# Patient Record
Sex: Male | Born: 1950
Health system: Southern US, Community
[De-identification: ages and names within clinical notes are randomized; demographics above are authoritative.]

## PROBLEM LIST (undated history)

## (undated) DIAGNOSIS — E785 Hyperlipidemia, unspecified: Secondary | ICD-10-CM

## (undated) DIAGNOSIS — K219 Gastro-esophageal reflux disease without esophagitis: Secondary | ICD-10-CM

## (undated) DIAGNOSIS — M6530 Trigger finger, unspecified finger: Secondary | ICD-10-CM

## (undated) DIAGNOSIS — M1990 Unspecified osteoarthritis, unspecified site: Secondary | ICD-10-CM

## (undated) DIAGNOSIS — R739 Hyperglycemia, unspecified: Secondary | ICD-10-CM

## (undated) DIAGNOSIS — Z8719 Personal history of other diseases of the digestive system: Secondary | ICD-10-CM

## (undated) DIAGNOSIS — H9190 Unspecified hearing loss, unspecified ear: Secondary | ICD-10-CM

## (undated) DIAGNOSIS — K4090 Unilateral inguinal hernia, without obstruction or gangrene, not specified as recurrent: Secondary | ICD-10-CM

## (undated) DIAGNOSIS — R251 Tremor, unspecified: Secondary | ICD-10-CM

## (undated) HISTORY — DX: Hyperglycemia, unspecified: R73.9

## (undated) HISTORY — DX: Tremor, unspecified: R25.1

## (undated) HISTORY — DX: Unspecified hearing loss, unspecified ear: H91.90

## (undated) HISTORY — DX: Trigger finger, unspecified finger: M65.30

## (undated) HISTORY — PX: CATARACT EXTRACTION, BILATERAL: SHX1313

## (undated) HISTORY — DX: Unilateral inguinal hernia, without obstruction or gangrene, not specified as recurrent: K40.90

## (undated) HISTORY — DX: Personal history of other diseases of the digestive system: Z87.19

## (undated) HISTORY — DX: Hyperlipidemia, unspecified: E78.5

---

## 2002-08-16 LAB — HM COLONOSCOPY: HM COLON: NORMAL

## 2004-05-27 ENCOUNTER — Ambulatory Visit: Payer: Self-pay | Admitting: Internal Medicine

## 2005-02-24 ENCOUNTER — Ambulatory Visit: Payer: Self-pay | Admitting: Ophthalmology

## 2006-01-31 ENCOUNTER — Emergency Department: Payer: Self-pay | Admitting: Emergency Medicine

## 2013-08-16 HISTORY — PX: COLONOSCOPY: SHX174

## 2014-04-11 LAB — LIPID PANEL
Cholesterol: 186 mg/dL (ref 0–200)
HDL: 52 mg/dL (ref 35–70)
LDL CALC: 114 mg/dL
TRIGLYCERIDES: 101 mg/dL (ref 40–160)

## 2014-04-11 LAB — HEMOGLOBIN A1C: HEMOGLOBIN A1C: 5.7 % (ref 4.0–6.0)

## 2014-05-23 LAB — HM COLONOSCOPY: HM Colonoscopy: NEGATIVE

## 2015-04-13 ENCOUNTER — Encounter: Payer: Self-pay | Admitting: Family Medicine

## 2015-04-13 DIAGNOSIS — H9190 Unspecified hearing loss, unspecified ear: Secondary | ICD-10-CM | POA: Insufficient documentation

## 2015-04-13 DIAGNOSIS — E663 Overweight: Secondary | ICD-10-CM | POA: Insufficient documentation

## 2015-04-13 DIAGNOSIS — K4090 Unilateral inguinal hernia, without obstruction or gangrene, not specified as recurrent: Secondary | ICD-10-CM | POA: Insufficient documentation

## 2015-04-13 DIAGNOSIS — R251 Tremor, unspecified: Secondary | ICD-10-CM | POA: Insufficient documentation

## 2015-04-14 ENCOUNTER — Encounter: Payer: Self-pay | Admitting: Family Medicine

## 2015-04-14 ENCOUNTER — Ambulatory Visit (INDEPENDENT_AMBULATORY_CARE_PROVIDER_SITE_OTHER): Payer: BLUE CROSS/BLUE SHIELD | Admitting: Family Medicine

## 2015-04-14 VITALS — BP 114/66 | HR 71 | Temp 98.3°F | Resp 16 | Ht 70.0 in | Wt 184.0 lb

## 2015-04-14 DIAGNOSIS — Z125 Encounter for screening for malignant neoplasm of prostate: Secondary | ICD-10-CM

## 2015-04-14 DIAGNOSIS — Z23 Encounter for immunization: Secondary | ICD-10-CM

## 2015-04-14 DIAGNOSIS — R739 Hyperglycemia, unspecified: Secondary | ICD-10-CM | POA: Diagnosis not present

## 2015-04-14 DIAGNOSIS — Z1211 Encounter for screening for malignant neoplasm of colon: Secondary | ICD-10-CM

## 2015-04-14 DIAGNOSIS — Z0000 Encounter for general adult medical examination without abnormal findings: Secondary | ICD-10-CM

## 2015-04-14 DIAGNOSIS — Z13220 Encounter for screening for lipoid disorders: Secondary | ICD-10-CM

## 2015-04-14 DIAGNOSIS — R251 Tremor, unspecified: Secondary | ICD-10-CM | POA: Diagnosis not present

## 2015-04-14 MED ORDER — ASPIRIN EC 81 MG PO TBEC: 81.0000 mg | DELAYED_RELEASE_TABLET | Freq: Every day | ORAL | Status: AC

## 2015-04-14 NOTE — Progress Notes (Signed)
Name: Lee Harvey   MRN: [id number]    DOB: 01/31/1951   Date:04/14/2015       Progress Note  Subjective  Chief Complaint  Chief Complaint  Patient presents with  . Annual Exam    HPI  Male Exam: he has not complaints, still has tremors on both hands - left worse than right - but does not want to be treated or see a neurologist for further evaluation. His decreased in hearing is stable.  He is active, push mow his yard once a week, goes bowling weekly. He has lost weight since he stopped drinking 3 cans of sodas daily, drinking water or unsweet tea.  He denies chest pain, decrease in exercise tolerance, or ED, he has nocturia once per night, but not every night. Inguinal hernia is not bothering him and I reviewed symptoms of incarceration and need to seek medical attention.     Patient Active Problem List   Diagnosis Date Noted  . Difficulty hearing 04/13/2015  . Inguinal hernia 04/13/2015  . Tremor of both hands 04/13/2015  . Excess weight 04/13/2015    Past Surgical History  Procedure Laterality Date  . Cataract extraction, bilateral      Family History  Problem Relation Age of Onset  . Dementia Mother   . Cancer Father 20    Colon  . Thyroid disease Sister   . Drug abuse Brother     Social History   Social History  . Marital Status: Single    Spouse Name: N/A  . Number of Children: N/A  . Years of Education: N/A   Occupational History  . Not on file.   Social History Main Topics  . Smoking status: Former Smoker -- 0.75 packs/day for 20 years    Types: Cigarettes    Start date: 08/16/1986    Quit date: 08/16/2006  . Smokeless tobacco: Never Used  . Alcohol Use: No  . Drug Use: No  . Sexual Activity:    Partners: Female   Other Topics Concern  . Not on file   Social History Narrative     Current outpatient prescriptions:  Marland Kitchen  MULTIPLE VITAMIN PO, Take 1 tablet by mouth daily., Disp: , Rfl:  .  aspirin EC 81 MG tablet, Take 1 tablet (81 mg total)  by mouth daily., Disp: 30 tablet, Rfl: 0  No Known Allergies   ROS  Constitutional: Negative for fever or weight change.  Respiratory: Negative for cough and shortness of breath.   Cardiovascular: Negative for chest pain or palpitations.  Gastrointestinal: Negative for abdominal pain, no bowel changes.  Musculoskeletal: Negative for gait problem or joint swelling.  Skin: Negative for rash.  Neurological: Negative for dizziness or headache.  No other specific complaints in a complete review of systems (except as listed in HPI above).  Objective  Filed Vitals:   04/14/15 0840  BP: 114/66  Pulse: 71  Temp: 98.3 F (36.8 C)  TempSrc: Oral  Resp: 16  Height: 5\' 10"  (1.778 m)  Weight: 184 lb (83.462 kg)  SpO2: 96%    Body mass index is 26.4 kg/(m^2).  Physical Exam  Constitutional: Patient appears well-developed and well-nourished. No distress.  HENT: Head: Normocephalic and atraumatic. Ears: B TMs ok, no erythema or effusion; Nose: Nose normal. Mouth/Throat: Oropharynx is clear and moist. No oropharyngeal exudate.  Eyes: Conjunctivae and EOM are normal. Pupils are equal, round, and reactive to light. No scleral icterus.  Neck: Normal range of motion. Neck supple. No  JVD present. No thyromegaly present.  Cardiovascular: Normal rate, regular rhythm and normal heart sounds.  No murmur heard. No BLE edema. Pulmonary/Chest: Effort normal and breath sounds normal. No respiratory distress. Abdominal: Soft. Bowel sounds are normal, no distension. There is no tenderness. no masses MALE GENITALIA: Normal descended testes bilaterally, no masses palpated, no hernias, no lesions, no discharge RECTAL: Prostate normal size and consistency, no rectal masses or hemorrhoids Musculoskeletal: Normal range of motion, no joint effusions. No gross deformities Neurological: he is alert and oriented to person, place, and time. No cranial nerve deficit. Coordination, balance, strength, speech and gait  are normal.  Skin: Skin is warm and dry. No rash noted. No erythema.  Psychiatric: Patient has a normal mood and affect. behavior is normal. Judgment and thought content normal.   PHQ2/9: Depression screen PHQ 2/9 04/14/2015  Decreased Interest 0  Down, Depressed, Hopeless 0  PHQ - 2 Score 0     Fall Risk: Fall Risk  04/14/2015  Falls in the past year? No      Assessment & Plan  1. Encounter for routine history and physical exam for male Discussed importance of 150 minutes of physical activity weekly, eat two servings of fish weekly, eat one serving of tree nuts ( cashews, pistachios, pecans, almonds.Marland Kitchen) every other day, eat 6 servings of fruit/vegetables daily and drink plenty of water and avoid sweet beverages.   2. Need for vaccination for H flu type B  - Flu Vaccine QUAD 36+ mos PF IM (Fluarix & Fluzone Quad PF)  3. Prostate cancer screening  - PSA  4. Colon cancer screening Had Cologuard in 2015  5. Hyperglycemia  - Hemoglobin A1c  6. Screening, lipid  - Lipid panel  7. Tremor of both hands He does not want to see neurologist at this time, no symptoms of Parkinson's may be essential tremors, does not want to be treated

## 2015-04-15 ENCOUNTER — Telehealth: Payer: Self-pay

## 2015-04-15 LAB — LIPID PANEL
CHOL/HDL RATIO: 2.6 ratio (ref 0.0–5.0)
Cholesterol, Total: 148 mg/dL (ref 100–199)
HDL: 57 mg/dL (ref >39)
LDL Calculated: 80 mg/dL (ref 0–99)
Triglycerides: 56 mg/dL (ref 0–149)
VLDL CHOLESTEROL CAL: 11 mg/dL (ref 5–40)

## 2015-04-15 LAB — HEMOGLOBIN A1C
Est. average glucose Bld gHb Est-mCnc: 120 mg/dL
Hgb A1c MFr Bld: 5.8 % — ABNORMAL HIGH (ref 4.8–5.6)

## 2015-04-15 LAB — PSA: Prostate Specific Ag, Serum: 1.2 ng/mL (ref 0.0–4.0)

## 2015-04-15 NOTE — Telephone Encounter (Signed)
Left message for patient to return my call for labs 

## 2015-04-15 NOTE — Telephone Encounter (Signed)
-----   Message from Steele Sizer, MD sent at 04/15/2015  8:30 AM EDT ----- hgbA1C slightly elevated Lipid panel within normal limits Normal PSA

## 2015-04-16 NOTE — Progress Notes (Signed)
Patient notified

## 2016-04-15 ENCOUNTER — Ambulatory Visit (INDEPENDENT_AMBULATORY_CARE_PROVIDER_SITE_OTHER): Payer: BLUE CROSS/BLUE SHIELD | Admitting: Family Medicine

## 2016-04-15 ENCOUNTER — Encounter: Payer: Self-pay | Admitting: Family Medicine

## 2016-04-15 VITALS — BP 136/74 | HR 56 | Temp 98.4°F | Resp 16 | Ht 70.0 in | Wt 193.2 lb

## 2016-04-15 DIAGNOSIS — Z125 Encounter for screening for malignant neoplasm of prostate: Secondary | ICD-10-CM

## 2016-04-15 DIAGNOSIS — Z23 Encounter for immunization: Secondary | ICD-10-CM | POA: Diagnosis not present

## 2016-04-15 DIAGNOSIS — Z0000 Encounter for general adult medical examination without abnormal findings: Secondary | ICD-10-CM

## 2016-04-15 DIAGNOSIS — R739 Hyperglycemia, unspecified: Secondary | ICD-10-CM | POA: Diagnosis not present

## 2016-04-15 LAB — CBC WITH DIFFERENTIAL/PLATELET
BASOS ABS: 59 {cells}/uL (ref 0–200)
Basophils Relative: 1 %
Eosinophils Absolute: 177 cells/uL (ref 15–500)
Eosinophils Relative: 3 %
HEMATOCRIT: 44.6 % (ref 38.5–50.0)
HEMOGLOBIN: 15.6 g/dL (ref 13.2–17.1)
LYMPHS ABS: 1770 {cells}/uL (ref 850–3900)
Lymphocytes Relative: 30 %
MCH: 32.6 pg (ref 27.0–33.0)
MCHC: 35 g/dL (ref 32.0–36.0)
MCV: 93.1 fL (ref 80.0–100.0)
MONO ABS: 531 {cells}/uL (ref 200–950)
MPV: 10.1 fL (ref 7.5–12.5)
Monocytes Relative: 9 %
NEUTROS PCT: 57 %
Neutro Abs: 3363 cells/uL (ref 1500–7800)
Platelets: 218 10*3/uL (ref 140–400)
RBC: 4.79 MIL/uL (ref 4.20–5.80)
RDW: 13.1 % (ref 11.0–15.0)
WBC: 5.9 10*3/uL (ref 3.8–10.8)

## 2016-04-15 LAB — COMPLETE METABOLIC PANEL WITH GFR
ALBUMIN: 4.3 g/dL (ref 3.6–5.1)
ALK PHOS: 86 U/L (ref 40–115)
ALT: 22 U/L (ref 9–46)
AST: 21 U/L (ref 10–35)
BUN: 17 mg/dL (ref 7–25)
CALCIUM: 9.4 mg/dL (ref 8.6–10.3)
CO2: 23 mmol/L (ref 20–31)
Chloride: 106 mmol/L (ref 98–110)
Creat: 1.13 mg/dL (ref 0.70–1.25)
GFR, EST NON AFRICAN AMERICAN: 69 mL/min (ref >=60)
GFR, Est African American: 80 mL/min (ref >=60)
Glucose, Bld: 107 mg/dL — ABNORMAL HIGH (ref 65–99)
POTASSIUM: 4.7 mmol/L (ref 3.5–5.3)
Sodium: 141 mmol/L (ref 135–146)
Total Bilirubin: 0.6 mg/dL (ref 0.2–1.2)
Total Protein: 7 g/dL (ref 6.1–8.1)

## 2016-04-15 LAB — PSA: PSA: 0.9 ng/mL (ref <=4.0)

## 2016-04-15 LAB — LIPID PANEL
CHOLESTEROL: 213 mg/dL — AB (ref 125–200)
HDL: 70 mg/dL (ref >=40)
LDL Cholesterol: 128 mg/dL (ref <130)
Total CHOL/HDL Ratio: 3 Ratio (ref <=5.0)
Triglycerides: 75 mg/dL (ref <150)
VLDL: 15 mg/dL (ref <30)

## 2016-04-15 NOTE — Progress Notes (Signed)
Name: Lee Harvey   MRN: [id number]    DOB: May 19, 1951   Date:04/15/2016       Progress Note  Subjective  Chief Complaint  Chief Complaint  Patient presents with  . Annual Exam    HPI  Male Exam: he has not complaints, still has tremors on both hands - left worse than right - but does not want to be treated or see a neurologist for further evaluation. His decreased in hearing is stable, he does not want hearing aids at this time.  He is active, he is walking a couple times a week and  goes bowling weekly. He has gained weight again, he is drinking sodas and sweet tea daily again.  He denies chest pain, decrease in exercise tolerance, or ED, he has nocturia once per night. Inguinal hernia is not bothering him and I reviewed symptoms of incarceration and need to seek medical attention.   IPSS Questionnaire (AUA-7): Over the past month.   1)  How often have you had a sensation of not emptying your bladder completely after you finish urinating?  1 - Less than 1 time in 5  2)  How often have you had to urinate again less than two hours after you finished urinating? 0 - Not at all  3)  How often have you found you stopped and started again several times when you urinated?  0 - Not at all  4) How difficult have you found it to postpone urination?  0 - Not at all  5) How often have you had a weak urinary stream?  1 - Less than 1 time in 5  6) How often have you had to push or strain to begin urination?  0 - Not at all  7) How many times did you most typically get up to urinate from the time you went to bed until the time you got up in the morning?  1 - 1 time  Total score:  0-7 mildly symptomatic   8-19 moderately symptomatic   20-35 severely symptomatic      Patient Active Problem List   Diagnosis Date Noted  . Hyperglycemia 04/15/2016  . Difficulty hearing 04/13/2015  . Inguinal hernia 04/13/2015  . Tremor of both hands 04/13/2015  . Excess weight 04/13/2015    Past Surgical  History:  Procedure Laterality Date  . CATARACT EXTRACTION, BILATERAL      Family History  Problem Relation Age of Onset  . Dementia Mother   . Cancer Father 65    Colon  . Thyroid disease Sister   . Drug abuse Brother     Social History   Social History  . Marital status: Single    Spouse name: N/A  . Number of children: N/A  . Years of education: N/A   Occupational History  . Not on file.   Social History Main Topics  . Smoking status: Former Smoker    Packs/day: 0.75    Years: 20.00    Types: Cigarettes    Start date: 08/16/1986    Quit date: 08/16/2006  . Smokeless tobacco: Never Used  . Alcohol use No  . Drug use: No  . Sexual activity: Yes    Partners: Female   Other Topics Concern  . Not on file   Social History Narrative  . No narrative on file     Current Outpatient Prescriptions:  .  aspirin EC 81 MG tablet, Take 1 tablet (81 mg total) by mouth daily., Disp:  30 tablet, Rfl: 0 .  MULTIPLE VITAMIN PO, Take 1 tablet by mouth daily., Disp: , Rfl:   No Known Allergies   ROS  Constitutional: Negative for fever or weight change.  Respiratory: Negative for cough and shortness of breath.   Cardiovascular: Negative for chest pain or palpitations.  Gastrointestinal: Negative for abdominal pain, no bowel changes.  Musculoskeletal: Negative for gait problem or joint swelling.  Skin: Negative for rash.  Neurological: Negative for dizziness or headache.  No other specific complaints in a complete review of systems (except as listed in HPI above).  Objective  Vitals:   04/15/16 0832  BP: 136/74  Pulse: (!) 56  Resp: 16  Temp: 98.4 F (36.9 C)  TempSrc: Oral  SpO2: 97%  Weight: 193 lb 3.2 oz (87.6 kg)  Height: 5\' 10"  (1.778 m)    Body mass index is 27.72 kg/m.  Physical Exam  Constitutional: Patient appears well-developed and well-nourished. No distress.  HENT: Head: Normocephalic and atraumatic. Ears: B TMs ok, no erythema or effusion; Nose:  Nose normal. Mouth/Throat: Oropharynx is clear and moist. No oropharyngeal exudate.  Eyes: Conjunctivae and EOM are normal. Pupils are equal, round, and reactive to light. No scleral icterus.  Neck: Normal range of motion. Neck supple. No JVD present. No thyromegaly present.  Cardiovascular: Normal rate, regular rhythm and normal heart sounds.  No murmur heard. No BLE edema. Pulmonary/Chest: Effort normal and breath sounds normal. No respiratory distress. Abdominal: Soft. Bowel sounds are normal, no distension. There is no tenderness. left direct large inguinal hernia. Advised surgery and he will call back when ready for referral  MALE GENITALIA: Normal descended testes bilaterally, no masses palpated, no hernias, no lesions, no discharge RECTAL: he was too tense and not able to have exam done Musculoskeletal: Normal range of motion, no joint effusions. No gross deformities Neurological: he is alert and oriented to person, place, and time. No cranial nerve deficit. Coordination, balance, strength, speech and gait are normal.  Skin: Skin is warm and dry. No rash noted. No erythema.  Psychiatric: Patient has a normal mood and affect. behavior is normal. Judgment and thought content normal.  PHQ2/9: Depression screen [facility] 2/9 04/15/2016 04/14/2015  Decreased Interest 0 0  Down, Depressed, Hopeless 0 0  PHQ - 2 Score 0 0    Fall Risk: Fall Risk  04/15/2016 04/14/2015  Falls in the past year? No No    Functional Status Survey: Is the patient deaf or have difficulty hearing?: No Does the patient have difficulty seeing, even when wearing glasses/contacts?: No Does the patient have difficulty concentrating, remembering, or making decisions?: No Does the patient have difficulty walking or climbing stairs?: No Does the patient have difficulty dressing or bathing?: No Does the patient have difficulty doing errands alone such as visiting a doctor's office or shopping?: No    Assessment & Plan  1.  Encounter for routine history and physical exam for male  - CBC with Differential/Platelet - COMPLETE METABOLIC PANEL WITH GFR - Lipid panel  2. Hyperglycemia  - Hemoglobin A1c  3. Prostate cancer screening  - PSA  4. Need for shingles vaccine  - Varicella-zoster vaccine subcutaneous  5. Flu vaccine need  - Flu Vaccine QUAD 36+ mos IM

## 2016-04-16 LAB — HEMOGLOBIN A1C
HEMOGLOBIN A1C: 5.6 % (ref <5.7)
Mean Plasma Glucose: 114 mg/dL

## 2016-04-22 ENCOUNTER — Other Ambulatory Visit: Payer: Self-pay | Admitting: Family Medicine

## 2016-04-22 DIAGNOSIS — E785 Hyperlipidemia, unspecified: Secondary | ICD-10-CM | POA: Insufficient documentation

## 2016-04-22 MED ORDER — ATORVASTATIN CALCIUM 40 MG PO TABS: 40.0000 mg | ORAL_TABLET | Freq: Every day | ORAL | 1 refills | Status: DC

## 2016-04-28 ENCOUNTER — Telehealth: Payer: Self-pay | Admitting: Family Medicine

## 2016-04-28 NOTE — Telephone Encounter (Signed)
Due to patient Insurance limitations they will only fill 30 days at a time and informed patient of this. Patient should still have the whole 5 months left at his pharmacy once finished this 30 day prescription. Informed patient not to call in prescription but instead call them and explain this.

## 2016-07-19 ENCOUNTER — Other Ambulatory Visit: Payer: Self-pay | Admitting: Family Medicine

## 2016-07-19 NOTE — Telephone Encounter (Signed)
Pt is requesting a refill on Atorvastatin. States he only have about 2 pills left. He is asking that you send the refill on [facility]. Patient is giving permission to leave detailed message with his nephew Jerel Staples [phone]

## 2016-07-20 MED ORDER — ATORVASTATIN CALCIUM 40 MG PO TABS: 40.0000 mg | ORAL_TABLET | Freq: Every day | ORAL | 1 refills | Status: DC

## 2016-07-20 NOTE — Telephone Encounter (Signed)
Pt last appt 04/15/16 next appt 10/20/16 please advise

## 2016-07-20 NOTE — Telephone Encounter (Signed)
Pt last appt 04/15/16 and next appt 10/20/16

## 2016-07-21 ENCOUNTER — Other Ambulatory Visit: Payer: Self-pay

## 2016-07-21 MED ORDER — ATORVASTATIN CALCIUM 40 MG PO TABS: 40.0000 mg | ORAL_TABLET | Freq: Every day | ORAL | 1 refills | Status: DC

## 2016-07-21 NOTE — Telephone Encounter (Signed)
Patient requesting refill of Atorvastatin to [facility]. 

## 2016-09-27 DIAGNOSIS — M25561 Pain in right knee: Secondary | ICD-10-CM | POA: Diagnosis not present

## 2016-09-27 DIAGNOSIS — J111 Influenza due to unidentified influenza virus with other respiratory manifestations: Secondary | ICD-10-CM | POA: Diagnosis not present

## 2016-10-20 ENCOUNTER — Ambulatory Visit
Admission: RE | Admit: 2016-10-20 | Discharge: 2016-10-20 | Disposition: A | Discharge status: Home / Self Care | Payer: BLUE CROSS/BLUE SHIELD | Source: Ambulatory Visit | Attending: Family Medicine | Admitting: Family Medicine

## 2016-10-20 ENCOUNTER — Ambulatory Visit (INDEPENDENT_AMBULATORY_CARE_PROVIDER_SITE_OTHER): Payer: BLUE CROSS/BLUE SHIELD | Admitting: Family Medicine

## 2016-10-20 ENCOUNTER — Encounter: Payer: Self-pay | Admitting: Family Medicine

## 2016-10-20 ENCOUNTER — Encounter: Payer: Self-pay | Admitting: *Deleted

## 2016-10-20 VITALS — BP 118/58 | HR 63 | Temp 98.2°F | Resp 16 | Ht 70.0 in | Wt 195.5 lb

## 2016-10-20 DIAGNOSIS — R739 Hyperglycemia, unspecified: Secondary | ICD-10-CM

## 2016-10-20 DIAGNOSIS — K4090 Unilateral inguinal hernia, without obstruction or gangrene, not specified as recurrent: Secondary | ICD-10-CM | POA: Diagnosis not present

## 2016-10-20 DIAGNOSIS — R251 Tremor, unspecified: Secondary | ICD-10-CM

## 2016-10-20 DIAGNOSIS — M25461 Effusion, right knee: Secondary | ICD-10-CM | POA: Insufficient documentation

## 2016-10-20 DIAGNOSIS — M25561 Pain in right knee: Secondary | ICD-10-CM | POA: Diagnosis not present

## 2016-10-20 DIAGNOSIS — G8929 Other chronic pain: Secondary | ICD-10-CM

## 2016-10-20 DIAGNOSIS — E7800 Pure hypercholesterolemia, unspecified: Secondary | ICD-10-CM

## 2016-10-20 LAB — COMPLETE METABOLIC PANEL WITH GFR
ALT: 23 U/L (ref 9–46)
AST: 19 U/L (ref 10–35)
Albumin: 4.3 g/dL (ref 3.6–5.1)
Alkaline Phosphatase: 107 U/L (ref 40–115)
BILIRUBIN TOTAL: 0.8 mg/dL (ref 0.2–1.2)
BUN: 13 mg/dL (ref 7–25)
CO2: 28 mmol/L (ref 20–31)
Calcium: 9 mg/dL (ref 8.6–10.3)
Chloride: 106 mmol/L (ref 98–110)
Creat: 1.03 mg/dL (ref 0.70–1.25)
GFR, EST AFRICAN AMERICAN: 88 mL/min (ref >=60)
GFR, EST NON AFRICAN AMERICAN: 76 mL/min (ref >=60)
GLUCOSE: 107 mg/dL — AB (ref 65–99)
POTASSIUM: 4.8 mmol/L (ref 3.5–5.3)
SODIUM: 141 mmol/L (ref 135–146)
Total Protein: 6.6 g/dL (ref 6.1–8.1)

## 2016-10-20 LAB — LIPID PANEL
CHOL/HDL RATIO: 1.9 ratio (ref <5.0)
CHOLESTEROL: 121 mg/dL (ref <200)
HDL: 65 mg/dL (ref >40)
LDL Cholesterol: 45 mg/dL (ref <100)
TRIGLYCERIDES: 55 mg/dL (ref <150)
VLDL: 11 mg/dL (ref <30)

## 2016-10-20 MED ORDER — ACETAMINOPHEN 500 MG PO TABS: 500.0000 mg | ORAL_TABLET | Freq: Four times a day (QID) | ORAL | 0 refills | Status: AC | PRN

## 2016-10-20 NOTE — Progress Notes (Signed)
Name: Lee Harvey   MRN: [id number]    DOB: [birth year]   Date:10/20/2016       Progress Note  Subjective  Chief Complaint  Chief Complaint  Patient presents with  . Hyperlipidemia    6 month follow up    HPI  Left inguinal hernia: present for years, but patient states starting to cause discomfort secondary to large bulging and would like to see a surgeon now  Hyperlipidemia: ASCVD was high above 10% and he started taking Atorvastatin and denies myalgia. No chest pain or decrease in exercise tolerance. Taking aspirin daily  Hand tremors: present for many years, not getting worse, he it does not affect his ability to function. He states his aunt also had it. No balance problems  Hyperglycemia: he denies polyphagia, polydipsia, or polyuria, he has nocturia about once per night on average  Right knee pain: he developed right knee pain on lateral aspect 4 months ago, worse when standing at work, better when elevates the knee. Pain is intermittent and aching, sometimes more intense. No effusion  Patient Active Problem List   Diagnosis Date Noted  . Hyperlipidemia 04/22/2016  . Hyperglycemia 04/15/2016  . Difficulty hearing 04/13/2015  . Inguinal hernia 04/13/2015  . Tremor of both hands 04/13/2015  . Excess weight 04/13/2015    Past Surgical History:  Procedure Laterality Date  . CATARACT EXTRACTION, BILATERAL      Family History  Problem Relation Age of Onset  . Dementia Mother   . Cancer Father 76    Colon  . Thyroid disease Sister   . Drug abuse Brother     Social History   Social History  . Marital status: Single    Spouse name: N/A  . Number of children: N/A  . Years of education: N/A   Occupational History  . Not on file.   Social History Main Topics  . Smoking status: Former Smoker    Packs/day: 0.75    Years: 20.00    Types: Cigarettes    Start date: 08/16/1986    Quit date: 08/16/2006  . Smokeless tobacco: Never Used  . Alcohol use No  . Drug use: No   . Sexual activity: Yes    Partners: Female   Other Topics Concern  . Not on file   Social History Narrative   He lives alone, brother lives in [place] in an assistance living facility.    He was married twice but never had children   He is very close to his sister, but she lives in [place].    He sees his nephew on a regular basis.      Current Outpatient Prescriptions:  .  aspirin EC 81 MG tablet, Take 1 tablet (81 mg total) by mouth daily., Disp: 30 tablet, Rfl: 0 .  atorvastatin (LIPITOR) 40 MG tablet, Take 1 tablet (40 mg total) by mouth daily., Disp: 90 tablet, Rfl: 1 .  MULTIPLE VITAMIN PO, Take 1 tablet by mouth daily., Disp: , Rfl:   No Known Allergies   ROS  Constitutional: Negative for fever or weight change.  Respiratory: Negative for cough and shortness of breath.   Cardiovascular: Negative for chest pain or palpitations.  Gastrointestinal: Negative for abdominal pain, no bowel changes.  Musculoskeletal: Negative for gait problem or joint swelling.  Skin: Negative for rash.  Neurological: Negative for dizziness or headache.  No other specific complaints in a complete review of systems (except as listed in HPI above).  Objective  Vitals:  10/20/16 0809  BP: (!) 118/58  Pulse: 63  Resp: 16  Temp: 98.2 F (36.8 C)  SpO2: 96%  Weight: 195 lb 8 oz (88.7 kg)  Height: 5\' 10"  (1.778 m)    Body mass index is 28.05 kg/m.  Physical Exam  Constitutional: Patient appears well-developed and well-nourished. Obese  No distress.  HEENT: head atraumatic, normocephalic, pupils equal and reactive to light, , neck supple, throat within normal limits Cardiovascular: Normal rate, regular rhythm and normal heart sounds.  No murmur heard. No BLE edema. Pulmonary/Chest: Effort normal and breath sounds normal. No respiratory distress. Abdominal: Soft.  There is no tenderness. Left inguinal hernia, direct ,  reducible, no signs of gangrene Psychiatric: Patient has a normal  mood and affect. behavior is normal. Judgment and thought content normal. Muscular skeletal: no knee effusion, good rom, no pain during palpation of popliteal fossa, pain with flexion, no redness or increase in warmth  PHQ2/9: Depression screen [facility] 2/9 10/20/2016 04/15/2016 04/14/2015  Decreased Interest 0 0 0  Down, Depressed, Hopeless 0 0 0  PHQ - 2 Score 0 0 0     Fall Risk: Fall Risk  10/20/2016 04/15/2016 04/14/2015  Falls in the past year? No No No     Functional Status Survey: Is the patient deaf or have difficulty hearing?: No Does the patient have difficulty seeing, even when wearing glasses/contacts?: No Does the patient have difficulty concentrating, remembering, or making decisions?: No Does the patient have difficulty walking or climbing stairs?: No Does the patient have difficulty dressing or bathing?: No Does the patient have difficulty doing errands alone such as visiting a doctor's office or shopping?: No    Assessment & Plan  1. Pure hypercholesterolemia  - EKG - COMPLETE METABOLIC PANEL WITH GFR - Lipid panel  2. Hyperglycemia  - Insulin, fasting  3. Tremor of both hands  stable  4. Left inguinal hernia  - Ambulatory referral to General Surgery He states he has discomfort in the area at times and wants to have it repaired   5. Chronic pain of right knee  - DG Knee Complete 4 Views Right; Future Likely OA, advised Tylenol extra strength

## 2016-10-21 LAB — INSULIN, FASTING: Insulin fasting, serum: 8.4 u[IU]/mL (ref 2.0–19.6)

## 2016-10-25 ENCOUNTER — Encounter: Payer: Self-pay | Admitting: *Deleted

## 2016-10-26 ENCOUNTER — Ambulatory Visit (INDEPENDENT_AMBULATORY_CARE_PROVIDER_SITE_OTHER): Payer: BLUE CROSS/BLUE SHIELD | Admitting: General Surgery

## 2016-10-26 ENCOUNTER — Encounter: Payer: Self-pay | Admitting: General Surgery

## 2016-10-26 VITALS — BP 130/74 | HR 74 | Resp 12 | Ht 70.0 in | Wt 193.0 lb

## 2016-10-26 DIAGNOSIS — K4090 Unilateral inguinal hernia, without obstruction or gangrene, not specified as recurrent: Secondary | ICD-10-CM | POA: Diagnosis not present

## 2016-10-26 NOTE — Progress Notes (Signed)
Patient ID: Lee Harvey, male   DOB: 03/05/1951, 66 y.o.   MRN: [id number]  Chief Complaint  Patient presents with  . Other    HPI [name] is a 66 y.o. male here today for an evaluation of a left inguinal hernia. He states he noticed this area about 3 years ago. He states that about a year ago he began noticing a burning pain with bending, lifting, and coughing. Patient notes that the area has gotten bigger over the last year. No GI complaints or changes in bowel movements. I have reviewed the history of present illness with the patient.   HPI  Past Medical History:  Diagnosis Date  . History of hemorrhoids   . HL (hearing loss)   . Hyperglycemia   . Left inguinal hernia   . Tremors of nervous system   . Trigger finger     Past Surgical History:  Procedure Laterality Date  . CATARACT EXTRACTION, BILATERAL    . COLONOSCOPY  2015    Family History  Problem Relation Age of Onset  . Dementia Mother   . Cancer Father 9    Colon  . Thyroid disease Sister   . Drug abuse Brother     Social History Social History  Substance Use Topics  . Smoking status: Former Smoker    Packs/day: 0.75    Years: 20.00    Types: Cigarettes    Start date: 08/16/1986    Quit date: 08/16/2006  . Smokeless tobacco: Never Used  . Alcohol use No    No Known Allergies  Current Outpatient Prescriptions  Medication Sig Dispense Refill  . acetaminophen (TYLENOL) 500 MG tablet Take 1 tablet (500 mg total) by mouth every 6 (six) hours as needed. 30 tablet 0  . aspirin EC 81 MG tablet Take 1 tablet (81 mg total) by mouth daily. 30 tablet 0  . atorvastatin (LIPITOR) 40 MG tablet Take 1 tablet (40 mg total) by mouth daily. 90 tablet 1  . MULTIPLE VITAMIN PO Take 1 tablet by mouth daily.     No current facility-administered medications for this visit.     Review of Systems Review of Systems  Constitutional: Negative.   Respiratory: Negative.   Cardiovascular: Negative.     Blood pressure  130/74, pulse 74, resp. rate 12, height 5\' 10"  (1.778 m), weight 193 lb (87.5 kg).  Physical Exam Physical Exam  Constitutional: He is oriented to person, place, and time. He appears well-developed and well-nourished.  Eyes: Conjunctivae are normal. No scleral icterus.  Neck: Neck supple.  Cardiovascular: Normal rate, regular rhythm and normal heart sounds.   Pulmonary/Chest: Effort normal and breath sounds normal.  Abdominal: Soft. Normal appearance and bowel sounds are normal. There is no hepatomegaly. There is no tenderness. A hernia is present. Hernia confirmed positive in the left inguinal area.  Lymphadenopathy:    He has no cervical adenopathy.  Neurological: He is alert and oriented to person, place, and time.  Skin: Skin is warm and dry.  ;left inguinal hernia is large, reducible with pressure but recurs easily  Data Reviewed Notes reviewed   Assessment    Reducible large left inguinal hernia.      Plan     Hernia precautions and incarceration were discussed with the patient. If they develop symptoms of an incarcerated hernia, they were encouraged to seek prompt medical attention.  I have recommended repair of the hernia using mesh on an outpatient basis in the near future. The  risk of infection was reviewed. The role of prosthetic mesh to minimize the risk of recurrence was reviewed. This information has been scribed by Gaspar Cola CMA.    The patient is scheduled for surgery at [facility] on 11/30/16. He will pre admit by phone. The patient is aware of date and instructions.    Lee Harvey 10/26/2016, 3:55 PM

## 2016-10-26 NOTE — Patient Instructions (Addendum)
Inguinal Hernia, Adult An inguinal hernia is when fat or the intestines push through the area where the leg meets the lower abdomen (groin) and create a rounded lump (bulge). This condition develops over time. There are three types of inguinal hernias. These types include:  Hernias that can be pushed back into the belly (are reducible).  Hernias that are not reducible (are incarcerated).  Hernias that are not reducible and lose their blood supply (are strangulated). This type of hernia requires emergency surgery. What are the causes? This condition is caused by having a weak spot in the muscles or tissue. This weakness lets the hernia poke through. This condition can be triggered by:  Suddenly straining the muscles of the lower abdomen.  Lifting heavy objects.  Straining to have a bowel movement. Difficult bowel movements (constipation) can lead to this.  Coughing. What increases the risk? This condition is more likely to develop in:  Men.  Pregnant women.  People who:  Are overweight.  Work in jobs that require long periods of standing or heavy lifting.  Have had an inguinal hernia before.  Smoke or have lung disease. These factors can lead to long-lasting (chronic) coughing. What are the signs or symptoms? Symptoms can depend on the size of the hernia. Often, a small inguinal hernia has no symptoms. Symptoms of a larger hernia include:  A lump in the groin. This is easier to see when the person is standing. It might not be visible when he or she is lying down.  Pain or burning in the groin. This occurs especially when lifting, straining, or coughing.  A dull ache or a feeling of pressure in the groin.  A lump in the scrotum in men. Symptoms of a strangulated inguinal hernia can include:  A bulge in the groin that is very painful and tender to the touch.  A bulge that turns red or purple.  Fever, nausea, and vomiting.  The inability to have a bowel movement or to  pass gas. How is this diagnosed? This condition is diagnosed with a medical history and physical exam. Your health care provider may feel your groin area and ask you to cough. How is this treated? Treatment for this condition varies depending on the size of your hernia and whether you have symptoms. If you do not have symptoms, your health care provider may have you watch your hernia carefully and come in for follow-up visits. If your hernia is larger or if you have symptoms, your treatment will include surgery. Follow these instructions at home: Lifestyle   Drink enough fluid to keep your urine clear or pale yellow.  Eat a diet that includes a lot of fiber. Eat plenty of fruits, vegetables, and whole grains. Talk with your health care provider if you have questions.  Avoid lifting heavy objects.  Avoid standing for long periods of time.  Do not use tobacco products, including cigarettes, chewing tobacco, or e-cigarettes. If you need help quitting, ask your health care provider.  Maintain a healthy weight. General instructions   Do not try to force the hernia back in.  Watch your hernia for any changes in color or size. Let your health care provider know if any changes occur.  Take over-the-counter and prescription medicines only as told by your health care provider.  Keep all follow-up visits as told by your health care provider. This is important. Contact a health care provider if:  You have a fever.  You have new symptoms.  Your   symptoms get worse. Get help right away if:  You have pain in the groin that suddenly gets worse.  A bulge in the groin gets bigger suddenly and does not go down.  You are a man and you have a sudden pain in the scrotum, or the size of your scrotum suddenly changes.  A bulge in the groin area becomes red or purple and is painful to the touch.  You have nausea or vomiting that does not go away.  You feel your heart beating a lot more quickly  than normal.  You cannot have a bowel movement or pass gas. This information is not intended to replace advice given to you by your health care provider. Make sure you discuss any questions you have with your health care provider. Document Released: 12/19/2008 Document Revised: 01/08/2016 Document Reviewed: 06/12/2014 Elsevier Interactive Patient Education  2017 [employer].  The patient is scheduled for surgery at [facility] on 11/30/16. He will pre admit by phone. The patient is aware of date and instructions.

## 2016-11-22 ENCOUNTER — Encounter
Admission: RE | Admit: 2016-11-22 | Discharge: 2016-11-22 | Disposition: A | Discharge status: Home / Self Care | Payer: BLUE CROSS/BLUE SHIELD | Source: Ambulatory Visit | Attending: General Surgery | Admitting: General Surgery

## 2016-11-22 HISTORY — DX: Unspecified osteoarthritis, unspecified site: M19.90

## 2016-11-22 HISTORY — DX: Gastro-esophageal reflux disease without esophagitis: K21.9

## 2016-11-22 NOTE — Patient Instructions (Signed)
  Your procedure is scheduled on: 11-30-16 Report to Same Day Surgery 2nd floor medical mall [facility] Entrance-take elevator on left to 2nd floor.  Check in with surgery information desk.) To find out your arrival time please call [phone] between 1PM - 3PM on 11-29-16  Remember: Instructions that are not followed completely may result in serious medical risk, up to and including death, or upon the discretion of your surgeon and anesthesiologist your surgery may need to be rescheduled.    _x___ 1. Do not eat food or drink liquids after midnight. No gum chewing or  hard candies.     __x__ 2. No Alcohol for 24 hours before or after surgery.   __x__3. No Smoking for 24 prior to surgery.   ____  4. Bring all medications with you on the day of surgery if instructed.    __x__ 5. Notify your doctor if there is any change in your medical condition     (cold, fever, infections).     Do not wear jewelry, make-up, hairpins, clips or nail polish.  Do not wear lotions, powders, or perfumes. You may wear deodorant.  Do not shave 48 hours prior to surgery. Men may shave face and neck.  Do not bring valuables to the hospital.    [facility] is not responsible for any belongings or valuables.               Contacts, dentures or bridgework may not be worn into surgery.  Leave your suitcase in the car. After surgery it may be brought to your room.  For patients admitted to the hospital, discharge time is determined by your                       treatment team.   Patients discharged the day of surgery will not be allowed to drive home.  You will need someone to drive you home and stay with you the night of your procedure.    Please read over the following fact sheets that you were given:   [facility] Preparing for Surgery and or MRSA Information   ____ Take anti-hypertensive (unless it includes a diuretic), cardiac, seizure, asthma,     anti-reflux and psychiatric medicines. These include:  1.  NONE  2.  3.  4.  5.  6.  ____Fleets enema or Magnesium Citrate as directed.   ____ Use CHG Soap or sage wipes as directed on instruction sheet   ____ Use inhalers on the day of surgery and bring to hospital day of surgery  ____ Stop Metformin and Janumet 2 days prior to surgery.    ____ Take 1/2 of usual insulin dose the night before surgery and none on the morning     surgery.   _x___ Follow recommendations from Cardiologist, Pulmonologist or PCP regarding stopping Aspirin, Coumadin, Pllavix ,Eliquis, Effient, or Pradaxa, and Pletal-OK TO CONTINUE 81 MG ASPIRIN-DO NOT TAKE AM OF SURGERY  ____Stop Anti-inflammatories such as Advil, Aleve, Ibuprofen, Motrin, Naproxen, Naprosyn, Goodies powders or aspirin products. OK to take Tylenol    ____ Stop supplements until after surgery.     ____ Bring C-Pap to the hospital.

## 2016-11-30 ENCOUNTER — Ambulatory Visit: Payer: BLUE CROSS/BLUE SHIELD | Admitting: Anesthesiology

## 2016-11-30 ENCOUNTER — Ambulatory Visit
Admission: RE | Admit: 2016-11-30 | Discharge: 2016-11-30 | Disposition: A | Discharge status: Home / Self Care | Payer: BLUE CROSS/BLUE SHIELD | Source: Ambulatory Visit | Attending: General Surgery | Admitting: General Surgery

## 2016-11-30 ENCOUNTER — Telehealth: Payer: Self-pay

## 2016-11-30 ENCOUNTER — Encounter
Admission: RE | Disposition: A | Discharge status: Home / Self Care | Payer: Self-pay | Source: Ambulatory Visit | Attending: General Surgery

## 2016-11-30 DIAGNOSIS — M1990 Unspecified osteoarthritis, unspecified site: Secondary | ICD-10-CM | POA: Diagnosis not present

## 2016-11-30 DIAGNOSIS — Z7982 Long term (current) use of aspirin: Secondary | ICD-10-CM | POA: Diagnosis not present

## 2016-11-30 DIAGNOSIS — K219 Gastro-esophageal reflux disease without esophagitis: Secondary | ICD-10-CM | POA: Diagnosis not present

## 2016-11-30 DIAGNOSIS — K4090 Unilateral inguinal hernia, without obstruction or gangrene, not specified as recurrent: Secondary | ICD-10-CM | POA: Diagnosis not present

## 2016-11-30 DIAGNOSIS — Z87891 Personal history of nicotine dependence: Secondary | ICD-10-CM | POA: Insufficient documentation

## 2016-11-30 HISTORY — PX: INGUINAL HERNIA REPAIR: SHX194

## 2016-11-30 SURGERY — REPAIR, HERNIA, INGUINAL, ADULT
Anesthesia: General | Laterality: Left | Wound class: Clean

## 2016-11-30 MED ORDER — PROPOFOL 10 MG/ML IV BOLUS
INTRAVENOUS | Status: DC | PRN
Start: 2016-11-30 — End: 2016-11-30
  Administered 2016-11-30: 160 mg via INTRAVENOUS

## 2016-11-30 MED ORDER — FENTANYL CITRATE (PF) 100 MCG/2ML IJ SOLN
INTRAMUSCULAR | Status: AC
  Filled 2016-11-30: qty 2

## 2016-11-30 MED ORDER — SUCCINYLCHOLINE CHLORIDE 20 MG/ML IJ SOLN
INTRAMUSCULAR | Status: AC
  Filled 2016-11-30: qty 1

## 2016-11-30 MED ORDER — LIDOCAINE HCL 1 % IJ SOLN
INTRAMUSCULAR | Status: DC | PRN
  Administered 2016-11-30: 30 mL
  Administered 2016-11-30: 20 mL

## 2016-11-30 MED ORDER — EPHEDRINE SULFATE 50 MG/ML IJ SOLN
INTRAMUSCULAR | Status: DC | PRN
Start: 2016-11-30 — End: 2016-11-30
  Administered 2016-11-30 (×2): 10 mg via INTRAVENOUS

## 2016-11-30 MED ORDER — CEFAZOLIN SODIUM-DEXTROSE 2-4 GM/100ML-% IV SOLN
INTRAVENOUS | Status: AC
  Administered 2016-11-30: 2 g via INTRAVENOUS
  Filled 2016-11-30: qty 100

## 2016-11-30 MED ORDER — MIDAZOLAM HCL 2 MG/2ML IJ SOLN
INTRAMUSCULAR | Status: AC
  Filled 2016-11-30: qty 2

## 2016-11-30 MED ORDER — FENTANYL CITRATE (PF) 100 MCG/2ML IJ SOLN
INTRAMUSCULAR | Status: DC | PRN
  Administered 2016-11-30: 25 ug via INTRAVENOUS
  Administered 2016-11-30: 50 ug via INTRAVENOUS

## 2016-11-30 MED ORDER — LACTATED RINGERS IV SOLN
INTRAVENOUS | Status: DC
  Administered 2016-11-30 (×2): via INTRAVENOUS

## 2016-11-30 MED ORDER — OXYCODONE-ACETAMINOPHEN 5-325 MG PO TABS: 1.0000 | ORAL_TABLET | ORAL | 0 refills | Status: DC | PRN

## 2016-11-30 MED ORDER — FENTANYL CITRATE (PF) 100 MCG/2ML IJ SOLN: 25.0000 ug | INTRAMUSCULAR | Status: DC | PRN

## 2016-11-30 MED ORDER — FAMOTIDINE 20 MG PO TABS
20.0000 mg | ORAL_TABLET | Freq: Once | ORAL | Status: AC
  Administered 2016-11-30: 20 mg via ORAL

## 2016-11-30 MED ORDER — CEFAZOLIN SODIUM-DEXTROSE 2-4 GM/100ML-% IV SOLN
2.0000 g | INTRAVENOUS | Status: AC
  Administered 2016-11-30: 2 g via INTRAVENOUS

## 2016-11-30 MED ORDER — SEVOFLURANE IN SOLN
RESPIRATORY_TRACT | Status: AC
  Filled 2016-11-30: qty 250

## 2016-11-30 MED ORDER — KETOROLAC TROMETHAMINE 30 MG/ML IJ SOLN
INTRAMUSCULAR | Status: DC | PRN
  Administered 2016-11-30: 30 mg via INTRAVENOUS

## 2016-11-30 MED ORDER — KETOROLAC TROMETHAMINE 30 MG/ML IJ SOLN
INTRAMUSCULAR | Status: AC
  Filled 2016-11-30: qty 1

## 2016-11-30 MED ORDER — LIDOCAINE HCL (CARDIAC) 20 MG/ML IV SOLN
INTRAVENOUS | Status: DC | PRN
Start: 2016-11-30 — End: 2016-11-30
  Administered 2016-11-30: 60 mg via INTRAVENOUS

## 2016-11-30 MED ORDER — LIDOCAINE HCL (PF) 1 % IJ SOLN
INTRAMUSCULAR | Status: AC
  Filled 2016-11-30: qty 30

## 2016-11-30 MED ORDER — ACETAMINOPHEN 10 MG/ML IV SOLN
INTRAVENOUS | Status: AC
  Filled 2016-11-30: qty 100

## 2016-11-30 MED ORDER — BUPIVACAINE HCL (PF) 0.5 % IJ SOLN
INTRAMUSCULAR | Status: AC
  Filled 2016-11-30: qty 30

## 2016-11-30 MED ORDER — ONDANSETRON HCL 4 MG/2ML IJ SOLN: 4.0000 mg | Freq: Once | INTRAMUSCULAR | Status: DC | PRN

## 2016-11-30 MED ORDER — ACETAMINOPHEN 10 MG/ML IV SOLN
INTRAVENOUS | Status: DC | PRN
  Administered 2016-11-30: 1000 mg via INTRAVENOUS

## 2016-11-30 MED ORDER — PROPOFOL 10 MG/ML IV BOLUS
INTRAVENOUS | Status: AC
  Filled 2016-11-30: qty 20

## 2016-11-30 MED ORDER — FAMOTIDINE 20 MG PO TABS
ORAL_TABLET | ORAL | Status: AC
Start: 2016-11-30 — End: 2016-11-30
  Administered 2016-11-30: 20 mg via ORAL
  Filled 2016-11-30: qty 1

## 2016-11-30 MED ORDER — MIDAZOLAM HCL 2 MG/2ML IJ SOLN
INTRAMUSCULAR | Status: DC | PRN
  Administered 2016-11-30: 2 mg via INTRAVENOUS

## 2016-11-30 MED ORDER — CHLORHEXIDINE GLUCONATE CLOTH 2 % EX PADS
6.0000 | MEDICATED_PAD | Freq: Once | CUTANEOUS | Status: AC
  Administered 2016-11-30: 6 via TOPICAL

## 2016-11-30 SURGICAL SUPPLY — 36 items
BLADE CLIPPER SURG (BLADE) ×2 IMPLANT
BLADE SURG 15 STRL SS SAFETY (BLADE) ×2 IMPLANT
CANISTER SUCT 1200ML W/VALVE (MISCELLANEOUS) ×2 IMPLANT
CHLORAPREP W/TINT 26ML (MISCELLANEOUS) ×2 IMPLANT
DECANTER SPIKE VIAL GLASS SM (MISCELLANEOUS) ×4 IMPLANT
DERMABOND ADVANCED (GAUZE/BANDAGES/DRESSINGS) ×1
DERMABOND ADVANCED .7 DNX12 (GAUZE/BANDAGES/DRESSINGS) ×1 IMPLANT
DRAIN PENROSE 1/4X12 LTX (DRAIN) ×2 IMPLANT
DRAPE LAPAROTOMY 100X77 ABD (DRAPES) ×2 IMPLANT
ELECT REM PT RETURN 9FT ADLT (ELECTROSURGICAL) ×2
ELECTRODE REM PT RTRN 9FT ADLT (ELECTROSURGICAL) ×1 IMPLANT
GLOVE BIO SURGEON STRL SZ7 (GLOVE) ×2 IMPLANT
GLOVE BIOGEL PI IND STRL 7.0 (GLOVE) ×1 IMPLANT
GLOVE BIOGEL PI INDICATOR 7.0 (GLOVE) ×1
GLOVE INDICATOR 7.0 STRL GRN (GLOVE) ×4 IMPLANT
GLOVE SKINSENSE NS SZ7.0 (GLOVE) ×2
GLOVE SKINSENSE STRL SZ7.0 (GLOVE) ×2 IMPLANT
GLOVE SURG SYN 6.5 ES PF (GLOVE) ×2 IMPLANT
GOWN STRL REUS W/ TWL LRG LVL3 (GOWN DISPOSABLE) ×3 IMPLANT
GOWN STRL REUS W/TWL LRG LVL3 (GOWN DISPOSABLE) ×3
KIT RM TURNOVER STRD PROC AR (KITS) ×2 IMPLANT
LABEL OR SOLS (LABEL) ×2 IMPLANT
MESH PARIETEX PROGRIP LEFT (Mesh General) ×2 IMPLANT
NEEDLE HYPO 22GX1.5 SAFETY (NEEDLE) ×2 IMPLANT
NEEDLE HYPO 25X1 1.5 SAFETY (NEEDLE) ×2 IMPLANT
NS IRRIG 500ML POUR BTL (IV SOLUTION) ×2 IMPLANT
PACK BASIN MINOR ARMC (MISCELLANEOUS) ×2 IMPLANT
SUT PDS 2-0 27IN (SUTURE) ×4 IMPLANT
SUT VIC AB 2-0 SH 27 (SUTURE) ×1
SUT VIC AB 2-0 SH 27XBRD (SUTURE) ×1 IMPLANT
SUT VIC AB 3-0 54X BRD REEL (SUTURE) IMPLANT
SUT VIC AB 3-0 BRD 54 (SUTURE)
SUT VIC AB 3-0 SH 27 (SUTURE) ×1
SUT VIC AB 3-0 SH 27X BRD (SUTURE) ×1 IMPLANT
SUT VIC AB 4-0 FS2 27 (SUTURE) ×2 IMPLANT
SYR CONTROL 10ML (SYRINGE) ×2 IMPLANT

## 2016-11-30 NOTE — Anesthesia Preprocedure Evaluation (Signed)
Anesthesia Evaluation  Patient identified by MRN, date of birth, ID band Patient awake    Reviewed: Allergy & Precautions, NPO status , Patient's Chart, lab work & pertinent test results  Airway Mallampati: III  TM Distance: <3 FB     Dental  (+) Partial Upper   Pulmonary former smoker,    Pulmonary exam normal        Cardiovascular negative cardio ROS Normal cardiovascular exam     Neuro/Psych negative neurological ROS  negative psych ROS   GI/Hepatic Neg liver ROS, GERD  Controlled,  Endo/Other  negative endocrine ROS  Renal/GU negative Renal ROS  negative genitourinary   Musculoskeletal  (+) Arthritis , Osteoarthritis,    Abdominal Normal abdominal exam  (+)   Peds negative pediatric ROS (+)  Hematology   Anesthesia Other Findings Past Medical History: No date: Arthritis     Comment: KNEE RIGHT No date: GERD (gastroesophageal reflux disease)     Comment: OCC No date: History of hemorrhoids No date: HL (hearing loss) No date: Hyperglycemia No date: Left inguinal hernia No date: Tremors of nervous system No date: Trigger finger  Reproductive/Obstetrics                             Anesthesia Physical Anesthesia Plan  ASA: II  Anesthesia Plan: General   Post-op Pain Management:    Induction: Intravenous  Airway Management Planned: LMA  Additional Equipment:   Intra-op Plan:   Post-operative Plan:   Informed Consent: I have reviewed the patients History and Physical, chart, labs and discussed the procedure including the risks, benefits and alternatives for the proposed anesthesia with the patient or authorized representative who has indicated his/her understanding and acceptance.   Dental advisory given  Plan Discussed with: Surgeon and CRNA  Anesthesia Plan Comments:         Anesthesia Quick Evaluation

## 2016-11-30 NOTE — H&P (Signed)
Lee Harvey is an 66 y.o. male.   Chief Complaint: hernia HPI: 66 yr old male with 3 yr history of swelling in left groin. Over the last yr it has gotten bigger and more uncomfortable-burning discomfort with activity. No history of GI problems, no urinary  symptoms.  Past Medical History:  Diagnosis Date  . Arthritis    KNEE RIGHT  . GERD (gastroesophageal reflux disease)    OCC  . History of hemorrhoids   . HL (hearing loss)   . Hyperglycemia   . Left inguinal hernia   . Tremors of nervous system   . Trigger finger     Past Surgical History:  Procedure Laterality Date  . CATARACT EXTRACTION, BILATERAL    . COLONOSCOPY  2015    Family History  Problem Relation Age of Onset  . Dementia Mother   . Cancer Father 60    Colon  . Thyroid disease Sister   . Drug abuse Brother    Social History:  reports that he quit smoking about 10 years ago. His smoking use included Cigarettes. He started smoking about 30 years ago. He has a 15.00 pack-year smoking history. He has never used smokeless tobacco. He reports that he does not drink alcohol or use drugs.  Allergies: No Known Allergies  Medications Prior to Admission  Medication Sig Dispense Refill  . acetaminophen (TYLENOL) 500 MG tablet Take 1 tablet (500 mg total) by mouth every 6 (six) hours as needed. (Patient taking differently: Take 500 mg by mouth 2 (two) times daily. ) 30 tablet 0  . aspirin EC 81 MG tablet Take 1 tablet (81 mg total) by mouth daily. 30 tablet 0  . atorvastatin (LIPITOR) 40 MG tablet Take 1 tablet (40 mg total) by mouth daily. (Patient taking differently: Take 40 mg by mouth daily at 6 PM. ) 90 tablet 1  . MULTIPLE VITAMIN PO Take 1 tablet by mouth daily.    . calcium carbonate (TUMS - DOSED IN MG ELEMENTAL CALCIUM) 500 MG chewable tablet Chew 1 tablet by mouth as needed for indigestion or heartburn.      No results found for this or any previous visit (from the past 48 hour(s)). No results  found.  Review of Systems  Constitutional: Negative.   Respiratory: Negative.   Cardiovascular: Negative.   Gastrointestinal: Negative.   Genitourinary: Negative.     Blood pressure 138/82, pulse (!) 57, temperature 97 F (36.1 C), temperature source Tympanic, resp. rate 16, height 5\' 11"  (1.803 m), weight 192 lb (87.1 kg), SpO2 98 %. Physical Exam  Constitutional: He is oriented to person, place, and time. He appears well-developed and well-nourished.  Eyes: Conjunctivae are normal. No scleral icterus.  Neck: Neck supple.  Cardiovascular: Normal rate, regular rhythm and normal heart sounds.   Respiratory: Effort normal and breath sounds normal.  GI: Soft. Bowel sounds are normal. There is no tenderness. A hernia is present. Hernia confirmed positive in the left inguinal area (large reducible left inguinal hernia).  Lymphadenopathy:    He has no cervical adenopathy.  Neurological: He is alert and oriented to person, place, and time.  Skin: Skin is warm and dry.     Assessment/Plan Symptomatic large left inguinal hernia. Proceed with repair as planned. Pt ha been fully advised on his office visit on 10/26/16  Christene Lye, MD 11/30/2016, 7:03 AM

## 2016-11-30 NOTE — Interval H&P Note (Signed)
History and Physical Interval Note:  11/30/2016 7:08 AM  Lee Harvey  has presented today for surgery, with the diagnosis of left inguinal hernia  The various methods of treatment have been discussed with the patient and family. After consideration of risks, benefits and other options for treatment, the patient has consented to  Procedure(s): HERNIA REPAIR INGUINAL ADULT (Left) as a surgical intervention .  The patient's history has been reviewed, patient examined, no change in status, stable for surgery.  I have reviewed the patient's chart and labs.  Questions were answered to the patient's satisfaction.     SANKAR,SEEPLAPUTHUR G

## 2016-11-30 NOTE — Anesthesia Postprocedure Evaluation (Signed)
Anesthesia Post Note  Patient: Lee Harvey  Procedure(s) Performed: Procedure(s) (LRB): HERNIA REPAIR INGUINAL ADULT (Left)  Patient location during evaluation: PACU Anesthesia Type: General Level of consciousness: awake and alert and oriented Pain management: pain level controlled Vital Signs Assessment: post-procedure vital signs reviewed and stable Respiratory status: spontaneous breathing Cardiovascular status: blood pressure returned to baseline Anesthetic complications: no     Last Vitals:  Vitals:   11/30/16 1016 11/30/16 1035  BP: (!) 143/69 136/73  Pulse: (!) 48 (!) 51  Resp:    Temp: 36.5 C     Last Pain:  Vitals:   11/30/16 1016  TempSrc:   PainSc: 2                  Quame Spratlin

## 2016-11-30 NOTE — Op Note (Signed)
Preop diagnosis: Left inguinal hernia  Post op diagnosis: Same-large direct type  Operation: Repair left inguinal hernia with Progrip mesh  Surgeon: Mckinley Jewel  Assistant:     Anesthesia: Gen.  Complications: None  EBL: Less than 5 mL  Drains: None  Description: Patient was put to sleep with an LMA. Left inguinal region was prepped and draped as sterile field and timeout performed. Local anesthetic containing 0.5% Marcaine mixed with 1% Xylocaine was instilled around the inguinal region-total of 50 mL used. Incision was made along the medial two thirds of the inguinal canal. This was deepened through the layers down to the external oblique and bleeding controlled cautery and ligatures of 3-0 Vicryl. External oblique was opened along the line of its fibers fibers through the external ring. The ilioinguinal nerve was identified and preserved. It was evident the patient had a very large direct hernia occupying the entire posterior wall of the inguinal canal. This hernia was freed from the cord area of the internal ring region and thereafter plicated with the running suture of 2-0 Vicryl. The cord was very narrow and contain no indirect hernia but had a small lipoma which was freed and excised. The Progrip  mesh was brought up placed around the cord and laid down to cover the entire posterior wall. The lateral ends were tucked underneath the external oblique. Medial and was tacked to the pubic tubercle with 2 stitches of 2-0 PDS. Wound was irrigated. External oblique closed with a running 2-0 PDS. Scarpa's fascia closed with running 3-0 Vicryl. Skin closed with subcuticular 4-0 Vicryl covered with Dermabond. Procedure was well-tolerated with no immediate problems encountered. Patient then was returned recovery room stable condition

## 2016-11-30 NOTE — Anesthesia Procedure Notes (Signed)
Procedure Name: LMA Insertion Date/Time: 11/30/2016 7:22 AM Performed by: Allean Found Pre-anesthesia Checklist: Patient identified, Emergency Drugs available, Suction available, Patient being monitored and Timeout performed Patient Re-evaluated:Patient Re-evaluated prior to inductionOxygen Delivery Method: Circle system utilized Preoxygenation: Pre-oxygenation with 100% oxygen Intubation Type: IV induction Ventilation: Mask ventilation without difficulty LMA: LMA inserted LMA Size: 5.0 Number of attempts: 1 Placement Confirmation: positive ETCO2 and breath sounds checked- equal and bilateral Tube secured with: Tape Dental Injury: Teeth and Oropharynx as per pre-operative assessment

## 2016-11-30 NOTE — Transfer of Care (Signed)
Immediate Anesthesia Transfer of Care Note  Patient: Lee Harvey  Procedure(s) Performed: Procedure(s): HERNIA REPAIR INGUINAL ADULT (Left)  Patient Location: PACU  Anesthesia Type:General  Level of Consciousness: sedated  Airway & Oxygen Therapy: Patient Spontanous Breathing and Patient connected to face mask oxygen  Post-op Assessment: Report given to RN and Post -op Vital signs reviewed and stable  Post vital signs: Reviewed and stable  Last Vitals:  Vitals:   11/30/16 0556 11/30/16 0849  BP: 138/82 118/69  Pulse: (!) 57 (!) 57  Resp: 16 10  Temp: 36.1 C 36.5 C    Last Pain:  Vitals:   11/30/16 0556  TempSrc: Tympanic         Complications: No apparent anesthesia complications

## 2016-11-30 NOTE — Anesthesia Post-op Follow-up Note (Cosign Needed)
Anesthesia QCDR form completed.        

## 2016-11-30 NOTE — Discharge Instructions (Signed)

## 2016-11-30 NOTE — Telephone Encounter (Signed)
Patient's Lee Harvey called and states that when they got the patient home today from surgery they removed his IV bandage and noticed that his hand was blue/green and swollen where the IV site was. He was concerned about what to do about this. I instructed him he may use ice to the area off and on and to let us know if he has any further swelling of his hand. I let him know this would resolve on its own.

## 2016-12-07 ENCOUNTER — Ambulatory Visit (INDEPENDENT_AMBULATORY_CARE_PROVIDER_SITE_OTHER): Payer: BLUE CROSS/BLUE SHIELD | Admitting: General Surgery

## 2016-12-07 ENCOUNTER — Encounter: Payer: Self-pay | Admitting: General Surgery

## 2016-12-07 VITALS — BP 122/68 | HR 72 | Resp 14 | Ht 70.0 in | Wt 205.0 lb

## 2016-12-07 DIAGNOSIS — K4090 Unilateral inguinal hernia, without obstruction or gangrene, not specified as recurrent: Secondary | ICD-10-CM

## 2016-12-07 NOTE — Progress Notes (Signed)
Patient ID: Lee Harvey, male   DOB: 1951-07-01, 67 y.o.   MRN: [id number]  Chief Complaint  Patient presents with  . Routine Post Op    left inguinal hernia     HPI BRAXDEN LOVERING is a 66 y.o. male here today for his post op left inguinal hernia repair don eon 11/30/2016. Patient states he is very sore and feels some burning pain. . Moves his bowels daily.  HPI  Past Medical History:  Diagnosis Date  . Arthritis    KNEE RIGHT  . GERD (gastroesophageal reflux disease)    OCC  . History of hemorrhoids   . HL (hearing loss)   . Hyperglycemia   . Left inguinal hernia   . Tremors of nervous system   . Trigger finger     Past Surgical History:  Procedure Laterality Date  . CATARACT EXTRACTION, BILATERAL    . COLONOSCOPY  2015  . INGUINAL HERNIA REPAIR Left 11/30/2016   Procedure: HERNIA REPAIR INGUINAL ADULT;  Surgeon: Christene Lye, MD;  Location: [facility] ORS;  Service: General;  Laterality: Left;    Family History  Problem Relation Age of Onset  . Dementia Mother   . Cancer Father 68    Colon  . Thyroid disease Sister   . Drug abuse Brother     Social History Social History  Substance Use Topics  . Smoking status: Former Smoker    Packs/day: 0.75    Years: 20.00    Types: Cigarettes    Start date: 08/16/1986    Quit date: 08/16/2006  . Smokeless tobacco: Never Used  . Alcohol use No    No Known Allergies  Current Outpatient Prescriptions  Medication Sig Dispense Refill  . acetaminophen (TYLENOL) 500 MG tablet Take 1 tablet (500 mg total) by mouth every 6 (six) hours as needed. (Patient taking differently: Take 500 mg by mouth 2 (two) times daily. ) 30 tablet 0  . aspirin EC 81 MG tablet Take 1 tablet (81 mg total) by mouth daily. 30 tablet 0  . atorvastatin (LIPITOR) 40 MG tablet Take 1 tablet (40 mg total) by mouth daily. (Patient taking differently: Take 40 mg by mouth daily at 6 PM. ) 90 tablet 1  . calcium carbonate (TUMS - DOSED IN MG ELEMENTAL CALCIUM)  500 MG chewable tablet Chew 1 tablet by mouth as needed for indigestion or heartburn.    . MULTIPLE VITAMIN PO Take 1 tablet by mouth daily.    Marland Kitchen oxyCODONE-acetaminophen (ROXICET) 5-325 MG tablet Take 1 tablet by mouth every 4 (four) hours as needed. 20 tablet 0   No current facility-administered medications for this visit.     Review of Systems Review of Systems  Constitutional: Negative.   Respiratory: Negative.   Cardiovascular: Negative.     Blood pressure 122/68, pulse 72, resp. rate 14, height 5\' 10"  (1.778 m), weight 205 lb (93 kg).  Physical Exam Physical Exam  Constitutional: He is oriented to person, place, and time. He appears well-developed and well-nourished.  Abdominal: Soft. Bowel sounds are normal.    Neurological: He is alert and oriented to person, place, and time.  Skin: Skin is warm and dry.    Data Reviewed Prior notes reviewed  Assessment    Left inguinal hernia repair - incision is clean and healing well. Some mild swelling and bruising, normal for this stage of healing. No hernia palpated on exam. Patient is stiff and hesitant to move for fear of pain, but advised  him it was safe to relax.     Plan    Instructed patient he could return to work and normal activity in 7-10 days. Patient to return in one month.      HPI, Physical Exam, Assessment and Plan have been scribed under the direction and in the presence of Mckinley Jewel, MD  Gaspar Cola, CMA  I have completed the exam and reviewed the above documentation for accuracy and completeness.  I agree with the above.  Haematologist has been used and any errors in dictation or transcription are unintentional.  Alayza Pieper G. Jamal Collin, M.D., F.A.C.S.  Junie Panning G 12/07/2016, 12:07 PM

## 2016-12-07 NOTE — Patient Instructions (Addendum)
Return on one month. Patient may return to work in 7 to 10 days.

## 2016-12-13 ENCOUNTER — Encounter: Payer: Self-pay | Admitting: *Deleted

## 2016-12-14 ENCOUNTER — Encounter: Payer: Self-pay | Admitting: *Deleted

## 2017-01-06 ENCOUNTER — Ambulatory Visit (INDEPENDENT_AMBULATORY_CARE_PROVIDER_SITE_OTHER): Payer: BLUE CROSS/BLUE SHIELD | Admitting: General Surgery

## 2017-01-06 ENCOUNTER — Ambulatory Visit: Payer: BLUE CROSS/BLUE SHIELD | Admitting: General Surgery

## 2017-01-06 ENCOUNTER — Encounter: Payer: Self-pay | Admitting: General Surgery

## 2017-01-06 VITALS — BP 122/68 | HR 62 | Resp 12 | Ht 70.0 in | Wt 202.0 lb

## 2017-01-06 DIAGNOSIS — K4090 Unilateral inguinal hernia, without obstruction or gangrene, not specified as recurrent: Secondary | ICD-10-CM

## 2017-01-06 NOTE — Progress Notes (Addendum)
Patient ID: Lee Harvey, male   DOB: [birth year], 66 y.o.   MRN: [id number]  Chief Complaint  Patient presents with  . Routine Post Op    HPI Lee Harvey is a 66 y.o. male.  Here today for postoperative visit, left inguinal hernia on 11-30-16, he states he is doing well. Denies any gastrointestinal issues, bowels are moving regular. Only occasional sharp pain but not consistent. He is back at work.  HPI  Past Medical History:  Diagnosis Date  . Arthritis    KNEE RIGHT  . GERD (gastroesophageal reflux disease)    OCC  . History of hemorrhoids   . HL (hearing loss)   . Hyperglycemia   . Left inguinal hernia   . Tremors of nervous system   . Trigger finger     Past Surgical History:  Procedure Laterality Date  . CATARACT EXTRACTION, BILATERAL    . COLONOSCOPY  2015  . INGUINAL HERNIA REPAIR Left 11/30/2016   Procedure: HERNIA REPAIR INGUINAL ADULT;  Surgeon: Christene Lye, MD;  Location: [facility] ORS;  Service: General;  Laterality: Left;    Family History  Problem Relation Age of Onset  . Dementia Mother   . Cancer Father 18       Colon  . Thyroid disease Sister   . Drug abuse Brother     Social History Social History  Substance Use Topics  . Smoking status: Former Smoker    Packs/day: 0.75    Years: 20.00    Types: Cigarettes    Start date: 08/16/1986    Quit date: 08/16/2006  . Smokeless tobacco: Never Used  . Alcohol use No    No Known Allergies  Current Outpatient Prescriptions  Medication Sig Dispense Refill  . acetaminophen (TYLENOL) 500 MG tablet Take 1 tablet (500 mg total) by mouth every 6 (six) hours as needed. (Patient taking differently: Take 500 mg by mouth 2 (two) times daily. ) 30 tablet 0  . aspirin EC 81 MG tablet Take 1 tablet (81 mg total) by mouth daily. 30 tablet 0  . atorvastatin (LIPITOR) 40 MG tablet Take 1 tablet (40 mg total) by mouth daily. (Patient taking differently: Take 40 mg by mouth daily at 6 PM. ) 90 tablet 1  . calcium  carbonate (TUMS - DOSED IN MG ELEMENTAL CALCIUM) 500 MG chewable tablet Chew 1 tablet by mouth as needed for indigestion or heartburn.    . MULTIPLE VITAMIN PO Take 1 tablet by mouth daily.     No current facility-administered medications for this visit.     Review of Systems Review of Systems  Constitutional: Negative.   Respiratory: Negative.   Cardiovascular: Negative.     Blood pressure 122/68, pulse 62, resp. rate 12, height 5\' 10"  (1.778 m), weight 202 lb (91.6 kg).  Physical Exam Physical Exam  Constitutional: He is oriented to person, place, and time. He appears well-developed and well-nourished.  Abdominal: Soft. Normal appearance. No hernia.    Neurological: He is alert and oriented to person, place, and time.  Skin: Skin is warm and dry.  Psychiatric: His behavior is normal.    Data Reviewed Prior notes reviewed   Assessment    Left inguinal hernia repair - incision is clean and well healed    Plan    Follow up as needed. The patient is aware to call back for any questions or concerns.        HPI, Physical Exam, Assessment and Plan have been scribed  under the direction and in the presence of Mckinley Jewel, MD  Karie Fetch, RN  I have completed the exam and reviewed the above documentation for accuracy and completeness.  I agree with the above.  Haematologist has been used and any errors in dictation or transcription are unintentional.  Seeplaputhur G. Jamal Collin, M.D., F.A.C.S.   Junie Panning G 01/06/2017, 1:54 PM

## 2017-01-06 NOTE — Patient Instructions (Signed)
The patient is aware to call back for any questions or concerns.  

## 2017-01-10 ENCOUNTER — Other Ambulatory Visit: Payer: Self-pay | Admitting: Family Medicine

## 2017-01-10 DIAGNOSIS — E7800 Pure hypercholesterolemia, unspecified: Secondary | ICD-10-CM

## 2017-01-11 NOTE — Telephone Encounter (Signed)
Pt has 89mo CPE and F/U s/c for September 2018. Refill provided.

## 2017-04-19 ENCOUNTER — Encounter: Payer: BLUE CROSS/BLUE SHIELD | Admitting: Family Medicine

## 2017-05-23 ENCOUNTER — Encounter: Payer: Self-pay | Admitting: Family Medicine

## 2017-05-23 ENCOUNTER — Ambulatory Visit (INDEPENDENT_AMBULATORY_CARE_PROVIDER_SITE_OTHER): Payer: PPO | Admitting: Family Medicine

## 2017-05-23 VITALS — BP 120/70 | HR 71 | Temp 98.3°F | Resp 14 | Ht 70.0 in | Wt 215.8 lb

## 2017-05-23 DIAGNOSIS — R351 Nocturia: Secondary | ICD-10-CM | POA: Diagnosis not present

## 2017-05-23 DIAGNOSIS — G8929 Other chronic pain: Secondary | ICD-10-CM | POA: Diagnosis not present

## 2017-05-23 DIAGNOSIS — E7800 Pure hypercholesterolemia, unspecified: Secondary | ICD-10-CM

## 2017-05-23 DIAGNOSIS — M25561 Pain in right knee: Secondary | ICD-10-CM | POA: Diagnosis not present

## 2017-05-23 DIAGNOSIS — Z125 Encounter for screening for malignant neoplasm of prostate: Secondary | ICD-10-CM | POA: Diagnosis not present

## 2017-05-23 DIAGNOSIS — Z0000 Encounter for general adult medical examination without abnormal findings: Secondary | ICD-10-CM | POA: Diagnosis not present

## 2017-05-23 DIAGNOSIS — H9193 Unspecified hearing loss, bilateral: Secondary | ICD-10-CM

## 2017-05-23 DIAGNOSIS — Z87891 Personal history of nicotine dependence: Secondary | ICD-10-CM

## 2017-05-23 DIAGNOSIS — Z23 Encounter for immunization: Secondary | ICD-10-CM | POA: Diagnosis not present

## 2017-05-23 DIAGNOSIS — R739 Hyperglycemia, unspecified: Secondary | ICD-10-CM | POA: Diagnosis not present

## 2017-05-23 DIAGNOSIS — Z79899 Other long term (current) drug therapy: Secondary | ICD-10-CM

## 2017-05-23 MED ORDER — ATORVASTATIN CALCIUM 40 MG PO TABS: 40.0000 mg | ORAL_TABLET | Freq: Every day | ORAL | 1 refills | Status: DC

## 2017-05-23 NOTE — Addendum Note (Signed)
Addended by: Inda Coke on: 05/23/2017 09:55 AM   Modules accepted: Orders

## 2017-05-23 NOTE — Patient Instructions (Signed)

## 2017-05-23 NOTE — Addendum Note (Signed)
Addended by: Chilton Greathouse on: 05/23/2017 09:52 AM   Modules accepted: Orders

## 2017-05-23 NOTE — Progress Notes (Addendum)
Patient: Lee Harvey, Male    DOB: [birth year], 66 y.o.   MRN: [id number]  Visit Date: 05/23/2017  Today's Provider: Loistine Chance, MD   Chief Complaint  Patient presents with  . Medicare Wellness  . Hyperlipidemia  . Osteoarthritis    Subjective:   Lee Harvey is a 66 y.o. male who presents today for his [facility]   Patient/Caregiver input:    He has noticed some hearing loss  Right knee OA: pain is on knee joint, doing better since he retire ( July 2018), used to stand all day on concrete floors. He states he takes Tylenol prn only when he is more active.   Hyperlipidemia;: taking Lipitor daily, denies chest pain, SOB or muscle aches. Taking aspirin daily also.   Hyperglycemia: last hgbA1C was normal, he denies polyphagia, polydipsia or polyuria.   HPI  Review of Systems Constitutional: Negative for fever or weight change.  Respiratory: Negative for cough and shortness of breath.  Denies snoring Cardiovascular: Negative for chest pain or palpitations.  Gastrointestinal: Negative for abdominal pain, no bowel changes.  Musculoskeletal: Negative for gait problem or joint swelling. He has intermittent right knee pain  Skin: Negative for rash.  Neurological: Negative for dizziness or headache.  No other specific complaints in a complete review of systems (except as listed in HPI above).  Past Medical History:  Diagnosis Date  . Arthritis    KNEE RIGHT  . GERD (gastroesophageal reflux disease)    OCC  . History of hemorrhoids   . HL (hearing loss)   . Hyperglycemia   . Left inguinal hernia   . Tremors of nervous system   . Trigger finger     Past Surgical History:  Procedure Laterality Date  . CATARACT EXTRACTION, BILATERAL    . COLONOSCOPY  2015  . INGUINAL HERNIA REPAIR Left 11/30/2016   Procedure: HERNIA REPAIR INGUINAL ADULT;  Surgeon: Christene Lye, MD;  Location: [facility] ORS;  Service: General;  Laterality: Left;    Family History   Problem Relation Age of Onset  . Dementia Mother   . Cancer Father 53       Colon  . Thyroid disease Sister   . Drug abuse Brother     Social History   Social History  . Marital status: Single    Spouse name: N/A  . Number of children: N/A  . Years of education: N/A   Occupational History  . Not on file.   Social History Main Topics  . Smoking status: Former Smoker    Packs/day: 0.75    Years: 20.00    Types: Cigarettes    Start date: 08/16/1986    Quit date: 08/16/2006  . Smokeless tobacco: Never Used  . Alcohol use No  . Drug use: No  . Sexual activity: Yes    Partners: Female    Birth control/ protection: Condom   Other Topics Concern  . Not on file   Social History Narrative   He lives alone, brother lives in [place] in an assistance living facility.    He was married twice but never had children   He is very close to his sister, but she lives in [place].    He sees his nephew on a regular basis.     Outpatient Encounter Prescriptions as of 05/23/2017  Medication Sig Note  . acetaminophen (TYLENOL) 500 MG tablet Take 1 tablet (500 mg total) by mouth every 6 (six) hours as needed. (Patient taking  differently: Take 500 mg by mouth 2 (two) times daily. )   . aspirin EC 81 MG tablet Take 1 tablet (81 mg total) by mouth daily.   . calcium carbonate (TUMS - DOSED IN MG ELEMENTAL CALCIUM) 500 MG chewable tablet Chew 1 tablet by mouth as needed for indigestion or heartburn.   . MULTIPLE VITAMIN PO Take 1 tablet by mouth daily. 04/14/2015: Received from: [facility]: Take by mouth.  . [DISCONTINUED] atorvastatin (LIPITOR) 40 MG tablet TAKE 1 TABLET BY MOUTH  DAILY   . atorvastatin (LIPITOR) 40 MG tablet Take 1 tablet (40 mg total) by mouth daily.    No facility-administered encounter medications on file as of 05/23/2017.     No Known Allergies  Care Team Updated in EHR: Yes  Last Vision Exam: 2017  Wears corrective lenses: Yes Last Dental  Exam: 2017 Last Hearing Exam:  Wears Hearing Aids: No  Functional Ability / Safety Screening 1.  Was the timed Get Up and Go test longer than 30 seconds?  yes 2.  Does the patient need help with the phone, transportation, shopping,      preparing meals, housework, laundry, medications, or managing money?  yes 3.  Does the patient's home have:  loose throw rugs in the hallway?   yes in his bedroom      Grab bars in the bathroom? no      Handrails on the stairs?   yes      Poor lighting?   yes 4.  Has the patient noticed any hearing difficulties?   yes  Diet Recall and Exercise Regimen:  Current Exercise Habits: Home exercise routine, Type of exercise: walking, Time (Minutes): 40, Frequency (Times/Week): 2, Weekly Exercise (Minutes/Week): 80, Intensity: Mild Exercise limited by: None identified  Eating out most days.  Fall Risk:   Fall Risk: Fall Risk  05/23/2017 10/20/2016 04/15/2016 04/14/2015  Falls in the past year? No No No No    Depression Screen:   Depression screen [facility] 2/9 05/23/2017 10/20/2016 04/15/2016 04/14/2015  Decreased Interest 0 0 0 0  Down, Depressed, Hopeless 0 0 0 0  PHQ - 2 Score 0 0 0 0     Does patient have a HCPOA?    yes If yes, name and contact information: " he thinks it is her niece: Gwyndolyn Saxon.  Does patient have a living will or MOST form?  yes  Cancer Screenings: Skin: no new lesions Lung: Low Dose CT Chest recommended if Age 77-80 years, 30 pack-year currently smoking OR have quit w/in 15years. Patient does not qualify.  Lifestyle risk factor issued reviewed: Diet, exercise, weight management, advised patient smoking is not healthy, nutrition/diet.   Prostate: discussed USPTD Colon: 2015  Additional Screenings:  Hepatitis B/HIV/Syphillis: he refuses today  Hepatitis C Screening: 2014 Intimate Partner Violence: N/A AAA Screen: Men age 4 to 13 years if ever smoked recommended to get a one time AAA ultrasound screening exam. Patient does  qualify.  Objective:   Vitals: BP 120/70 (BP Location: Right Arm, Patient Position: Sitting, Cuff Size: Large)   Pulse 71   Temp 98.3 F (36.8 C) (Oral)   Resp 14   Ht 5\' 10"  (1.778 m)   Wt 215 lb 12.8 oz (97.9 kg)   SpO2 94%   BMI 30.96 kg/m  Body mass index is 30.96 kg/m.  Lab Results  Component Value Date   CHOL 121 10/20/2016   CHOL 213 (H) 04/15/2016   CHOL 148 04/14/2015  Lab Results  Component Value Date   HDL 65 10/20/2016   HDL 70 04/15/2016   HDL 57 04/14/2015   Lab Results  Component Value Date   LDLCALC 45 10/20/2016   LDLCALC 128 04/15/2016   LDLCALC 80 04/14/2015   Lab Results  Component Value Date   TRIG 55 10/20/2016   TRIG 75 04/15/2016   TRIG 56 04/14/2015   Lab Results  Component Value Date   CHOLHDL 1.9 10/20/2016   CHOLHDL 3.0 04/15/2016   CHOLHDL 2.6 04/14/2015   No results found for: LDLDIRECT  No exam data present  Physical Exam Constitutional: Patient appears well-developed and well-nourished. Obese  No distress.  HEENT: head atraumatic, normocephalic, pupils equal and reactive to light, ears , neck supple, throat within normal limits Cardiovascular: Normal rate, regular rhythm and normal heart sounds.  No murmur heard. Trace  BLE edema. Pulmonary/Chest: Effort normal and breath sounds normal. No respiratory distress. Abdominal: Soft.  There is no tenderness. Psychiatric: Patient has a normal mood and affect. behavior is normal. Judgment and thought content normal. Neurological : hand tremors - he states worse when nervous ( discussed referral to neurologist - he wants to hold off for now) - he states aunt had same symptoms - possibly familial.   IPSS Questionnaire (AUA-7): Over the past month.   1)  How often have you had a sensation of not emptying your bladder completely after you finish urinating?  3 - About half the time  2)  How often have you had to urinate again less than two hours after you finished urinating? 2 - Less  than half the time  3)  How often have you found you stopped and started again several times when you urinated?  2 - Less than half the time  4) How difficult have you found it to postpone urination?  0 - Not at all  5) How often have you had a weak urinary stream?  3 - About half the time  6) How often have you had to push or strain to begin urination?  0 - Not at all  7) How many times did you most typically get up to urinate from the time you went to bed until the time you got up in the morning?  2 - 2 times  Total score:  0-7 mildly symptomatic   8-19 moderately symptomatic   20-35 severely symptomatic   He states symptoms do not bother him, and does not want to start medication  Cognitive Testing - 6-CIT  Correct? Score   What year is it? yes 0 Yes = 0    No = 4  What month is it? yes 0 Yes = 0    No = 3  Remember:     Pia [address], [place], [place]     What time is it? yes 0 Yes = 0    No = 3  Count backwards from 20 to 1 yes 0 Correct = 0    1 error = 2   More than 1 error = 4  Say the months of the year in reverse. yes 0 Correct = 0    1 error = 2   More than 1 error = 4  What address did I ask you to remember? yes 0 Correct = 0  1 error = 2    2 error = 4    3 error = 6    4 error = 8  All wrong = 10       TOTAL SCORE  0/28   Interpretation:  Normal  Normal (0-7) Abnormal (8-28)   Fall Risk: Fall Risk  05/23/2017 10/20/2016 04/15/2016 04/14/2015  Falls in the past year? No No No No    Depression Screen Depression screen [facility] 2/9 05/23/2017 10/20/2016 04/15/2016 04/14/2015  Decreased Interest 0 0 0 0  Down, Depressed, Hopeless 0 0 0 0  PHQ - 2 Score 0 0 0 0    No results found for this or any previous visit (from the past 2160 hour(s)).   Assessment & Plan:     1. Welcome to Medicare preventive visit  - VAS [place] AAA DUPLEX; Future  2. Pure hypercholesterolemia  - atorvastatin (LIPITOR) 40 MG tablet; Take 1 tablet (40 mg total) by mouth daily.  Dispense: 90 tablet;  Refill: 1  3. Flu vaccine need  - Flu Vaccine QUAD 6+ mos PF IM (Fluarix Quad PF)  4. Hyperglycemia  - Hemoglobin A1c - Lipid panel  5. Chronic pain of right knee  Doing better, OA  6. History of tobacco use  - VAS [place] AAA DUPLEX; Future  7. Long-term use of high-risk medication  - COMPLETE METABOLIC PANEL WITH GFR - CBC With Differential  8. Bilateral hearing loss, unspecified hearing loss type  - Ambulatory referral to ENT  9. Nocturia  - PSA  10. Encounter for prostate cancer screening  - PSA  Exercise Activities and Dietary recommendations Goals    None     Discussed health benefits of physical activity, and encouraged him to engage in regular exercise appropriate for his age and condition.   Immunization History  Administered Date(s) Administered  . Influenza,inj,Quad PF,6+ Mos 04/14/2015, 04/15/2016  . Influenza-Unspecified 04/11/2014  . Tdap 04/11/2014  . Zoster 04/15/2016    Health Maintenance  Topic Date Due  . HIV Screening  08/10/2029 (Originally 06/15/1966)  . TETANUS/TDAP  04/11/2024  . COLONOSCOPY  05/23/2024  . INFLUENZA VACCINE  Completed  . Hepatitis C Screening  Completed     Meds ordered this encounter  Medications  . atorvastatin (LIPITOR) 40 MG tablet    Sig: Take 1 tablet (40 mg total) by mouth daily.    Dispense:  90 tablet    Refill:  1    Current Outpatient Prescriptions:  .  acetaminophen (TYLENOL) 500 MG tablet, Take 1 tablet (500 mg total) by mouth every 6 (six) hours as needed. (Patient taking differently: Take 500 mg by mouth 2 (two) times daily. ), Disp: 30 tablet, Rfl: 0 .  aspirin EC 81 MG tablet, Take 1 tablet (81 mg total) by mouth daily., Disp: 30 tablet, Rfl: 0 .  calcium carbonate (TUMS - DOSED IN MG ELEMENTAL CALCIUM) 500 MG chewable tablet, Chew 1 tablet by mouth as needed for indigestion or heartburn., Disp: , Rfl:  .  MULTIPLE VITAMIN PO, Take 1 tablet by mouth daily., Disp: , Rfl:  .  atorvastatin  (LIPITOR) 40 MG tablet, Take 1 tablet (40 mg total) by mouth daily., Disp: 90 tablet, Rfl: 1 Medications Discontinued During This Encounter  Medication Reason  . atorvastatin (LIPITOR) 40 MG tablet Reorder    I have personally reviewed and addressed the Medicare Annual Wellness health risk assessment questionnaire and have noted the following in the patient's chart:  A.         Medical and social history & family history B.         Use of alcohol, tobacco or  illicit drugs  C.         Current medications and supplements D.         Functional and Cognitive ability and status E.         Nutritional status F.         Physical activity G.        Advance directives H.         List of other physicians I.          Hospitalizations, surgeries, and ER visits in previous 12 months J.         [place] such as hearing and vision if needed, cognitive and depression L.         Referrals and appointments - entered   In addition, I have reviewed and discussed with patient certain preventive protocols, quality metrics, and best practice recommendations. A written personalized care plan for preventive services as well as general preventive health recommendations were provided to patient.  See attached scanned questionnaire for additional information.

## 2017-05-24 LAB — COMPLETE METABOLIC PANEL WITH GFR
AG RATIO: 1.8 (calc) (ref 1.0–2.5)
ALBUMIN MSPROF: 4.6 g/dL (ref 3.6–5.1)
ALKALINE PHOSPHATASE (APISO): 109 U/L (ref 40–115)
ALT: 30 U/L (ref 9–46)
AST: 26 U/L (ref 10–35)
BILIRUBIN TOTAL: 0.9 mg/dL (ref 0.2–1.2)
BUN: 12 mg/dL (ref 7–25)
CHLORIDE: 106 mmol/L (ref 98–110)
CO2: 29 mmol/L (ref 20–32)
Calcium: 9.5 mg/dL (ref 8.6–10.3)
Creat: 1.05 mg/dL (ref 0.70–1.25)
GFR, EST AFRICAN AMERICAN: 87 mL/min/{1.73_m2} (ref >=60)
GFR, Est Non African American: 75 mL/min/{1.73_m2} (ref >=60)
GLOBULIN: 2.6 g/dL (ref 1.9–3.7)
GLUCOSE: 110 mg/dL — AB (ref 65–99)
Potassium: 5 mmol/L (ref 3.5–5.3)
SODIUM: 141 mmol/L (ref 135–146)
TOTAL PROTEIN: 7.2 g/dL (ref 6.1–8.1)

## 2017-05-24 LAB — CBC WITH DIFFERENTIAL/PLATELET
BASOS ABS: 59 {cells}/uL (ref 0–200)
Basophils Relative: 0.9 %
EOS ABS: 169 {cells}/uL (ref 15–500)
EOS PCT: 2.6 %
HEMATOCRIT: 43.1 % (ref 38.5–50.0)
HEMOGLOBIN: 14.9 g/dL (ref 13.2–17.1)
Lymphs Abs: 1716 cells/uL (ref 850–3900)
MCH: 31.4 pg (ref 27.0–33.0)
MCHC: 34.6 g/dL (ref 32.0–36.0)
MCV: 90.9 fL (ref 80.0–100.0)
MONOS PCT: 10.2 %
MPV: 10.8 fL (ref 7.5–12.5)
NEUTROS ABS: 3894 {cells}/uL (ref 1500–7800)
NEUTROS PCT: 59.9 %
Platelets: 188 10*3/uL (ref 140–400)
RBC: 4.74 10*6/uL (ref 4.20–5.80)
RDW: 12.5 % (ref 11.0–15.0)
Total Lymphocyte: 26.4 %
WBC mixed population: 663 cells/uL (ref 200–950)
WBC: 6.5 10*3/uL (ref 3.8–10.8)

## 2017-05-24 LAB — LIPID PANEL
CHOLESTEROL: 128 mg/dL (ref <200)
HDL: 58 mg/dL (ref >40)
LDL CHOLESTEROL (CALC): 52 mg/dL
Non-HDL Cholesterol (Calc): 70 mg/dL (calc) (ref <130)
TRIGLYCERIDES: 97 mg/dL (ref <150)
Total CHOL/HDL Ratio: 2.2 (calc) (ref <5.0)

## 2017-05-24 LAB — HEMOGLOBIN A1C
EAG (MMOL/L): 6.3 (calc)
Hgb A1c MFr Bld: 5.6 % of total Hgb (ref <5.7)
Mean Plasma Glucose: 114 (calc)

## 2017-05-24 LAB — PSA: PSA: 0.9 ng/mL (ref <=4.0)

## 2017-08-22 ENCOUNTER — Telehealth: Payer: Self-pay

## 2017-08-22 NOTE — Telephone Encounter (Signed)
Spoke with patient and he reports receiving a letter to schedule ultrasound and he is currently out of town. He is a little unsure when he will be back in town and advised that he can just call us when he returns and can assist him with scheduling at that time. He was appreciative for the call back and has no further questions at this time.

## 2017-08-22 NOTE — Telephone Encounter (Signed)
Patient is calling regarding a letter he received for a AAA He says that Dr Ancil Boozer referred him for this but there is no order He is currently out of town and would not be able to schedule an appt until the end of February Please call to discuss

## 2017-08-25 ENCOUNTER — Encounter: Payer: PPO | Admitting: Family Medicine

## 2017-10-07 DIAGNOSIS — J019 Acute sinusitis, unspecified: Secondary | ICD-10-CM | POA: Diagnosis not present

## 2017-10-19 ENCOUNTER — Encounter: Payer: PPO | Admitting: Family Medicine

## 2017-11-15 ENCOUNTER — Telehealth: Payer: Self-pay | Admitting: Family Medicine

## 2017-11-15 ENCOUNTER — Other Ambulatory Visit: Payer: Self-pay | Admitting: Family Medicine

## 2017-11-15 DIAGNOSIS — E7800 Pure hypercholesterolemia, unspecified: Secondary | ICD-10-CM

## 2017-11-15 MED ORDER — ATORVASTATIN CALCIUM 40 MG PO TABS: 40.0000 mg | ORAL_TABLET | Freq: Every day | ORAL | 1 refills | Status: DC

## 2017-11-15 NOTE — Telephone Encounter (Signed)
Refill Request for Cholesterol medication. Lipitor  40 mg  Last physical: 05/23/2017  Lab Results  Component Value Date   CHOL 128 05/23/2017   HDL 58 05/23/2017   LDLCALC 52 05/23/2017   TRIG 97 05/23/2017   CHOLHDL 2.2 05/23/2017    Follow up visit: 01/30/2018

## 2017-11-15 NOTE — Telephone Encounter (Signed)
Nephew said that patient has been out of town since his Welcome to [facility] 10/218 and they are ready to schedule his AAA ultrasound discussed during visit.

## 2017-11-15 NOTE — Telephone Encounter (Signed)
Needs refill on Lipitor called in.  He has enough for a few more days but not enough to get him through to next visit.

## 2018-01-04 ENCOUNTER — Telehealth: Payer: Self-pay | Admitting: Family Medicine

## 2018-01-04 NOTE — Telephone Encounter (Signed)
Appt made

## 2018-01-04 NOTE — Telephone Encounter (Signed)
Called to schedule AWV w [facility] but unable to leave voice message.  He has physical is June and needs to be completed prior to 01/30/2018

## 2018-01-13 ENCOUNTER — Ambulatory Visit (INDEPENDENT_AMBULATORY_CARE_PROVIDER_SITE_OTHER): Payer: PPO

## 2018-01-13 VITALS — BP 138/68 | HR 66 | Temp 98.0°F | Resp 12 | Ht 70.0 in | Wt 221.9 lb

## 2018-01-13 DIAGNOSIS — Z0000 Encounter for general adult medical examination without abnormal findings: Secondary | ICD-10-CM | POA: Diagnosis not present

## 2018-01-13 DIAGNOSIS — Z23 Encounter for immunization: Secondary | ICD-10-CM

## 2018-01-13 NOTE — Patient Instructions (Signed)
Lee Harvey , Thank you for taking time to come for your Medicare Wellness Visit. I appreciate your ongoing commitment to your health goals. Please review the following plan we discussed and let me know if I can assist you in the future.   Screening recommendations/referrals: Colorectal Screening: Up to date Lung Cancer Screening: You do not qualify for this screening Hepatitis C Screening: Up to date  Vision and Dental Exams: Recommended annual ophthalmology exams for early detection of glaucoma and other disorders of the eye Recommended annual dental exams for proper oral hygiene  Vaccinations: Influenza vaccine: Up to date Pneumococcal vaccine: Completed 1 of 2 Tdap vaccine: Up to date Shingles vaccine: Please call your insurance company to determine your out of pocket expense for the Shingrix vaccine. You may also receive this vaccine at your local pharmacy or Health Dept.   Advanced directives: Advance directive discussed with you today. I have provided a copy for you to complete at home and have notarized. Once this is complete please bring a copy in to our office so we can scan it into your chart.  Conditions/risks identified: Recommend to drink at least 6-8 8oz glasses of water per day.  Next appointment: Please schedule your Annual Wellness Visit with your Nurse Health Advisor in one year.  Preventive Care 21 Years and Older, Male Preventive care refers to lifestyle choices and visits with your health care provider that can promote health and wellness. What does preventive care include?  A yearly physical exam. This is also called an annual well check.  Dental exams once or twice a year.  Routine eye exams. Ask your health care provider how often you should have your eyes checked.  Personal lifestyle choices, including:  Daily care of your teeth and gums.  Regular physical activity.  Eating a healthy diet.  Avoiding tobacco and drug use.  Limiting alcohol  use.  Practicing safe sex.  Taking low doses of aspirin every day.  Taking vitamin and mineral supplements as recommended by your health care provider. What happens during an annual well check? The services and screenings done by your health care provider during your annual well check will depend on your age, overall health, lifestyle risk factors, and family history of disease. Counseling  Your health care provider may ask you questions about your:  Alcohol use.  Tobacco use.  Drug use.  Emotional well-being.  Home and relationship well-being.  Sexual activity.  Eating habits.  History of falls.  Memory and ability to understand (cognition).  Work and work Statistician. Screening  You may have the following tests or measurements:  Height, weight, and BMI.  Blood pressure.  Lipid and cholesterol levels. These may be checked every 5 years, or more frequently if you are over 32 years old.  Skin check.  Lung cancer screening. You may have this screening every year starting at age 32 if you have a 30-pack-year history of smoking and currently smoke or have quit within the past 15 years.  Fecal occult blood test (FOBT) of the stool. You may have this test every year starting at age 42.  Flexible sigmoidoscopy or colonoscopy. You may have a sigmoidoscopy every 5 years or a colonoscopy every 10 years starting at age 23.  Prostate cancer screening. Recommendations will vary depending on your family history and other risks.  Hepatitis C blood test.  Hepatitis B blood test.  Sexually transmitted disease (STD) testing.  Diabetes screening. This is done by checking your blood sugar (glucose)  after you have not eaten for a while (fasting). You may have this done every 1-3 years.  Abdominal aortic aneurysm (AAA) screening. You may need this if you are a current or former smoker.  Osteoporosis. You may be screened starting at age 80 if you are at high risk. Talk with your  health care provider about your test results, treatment options, and if necessary, the need for more tests. Vaccines  Your health care provider may recommend certain vaccines, such as:  Influenza vaccine. This is recommended every year.  Tetanus, diphtheria, and acellular pertussis (Tdap, Td) vaccine. You may need a Td booster every 10 years.  Zoster vaccine. You may need this after age 25.  Pneumococcal 13-valent conjugate (PCV13) vaccine. One dose is recommended after age 72.  Pneumococcal polysaccharide (PPSV23) vaccine. One dose is recommended after age 39. Talk to your health care provider about which screenings and vaccines you need and how often you need them. This information is not intended to replace advice given to you by your health care provider. Make sure you discuss any questions you have with your health care provider. Document Released: 08/29/2015 Document Revised: 04/21/2016 Document Reviewed: 06/03/2015 Elsevier Interactive Patient Education  2017 [employer] Prevention in the Home Falls can cause injuries. They can happen to people of all ages. There are many things you can do to make your home safe and to help prevent falls. What can I do on the outside of my home?  Regularly fix the edges of walkways and driveways and fix any cracks.  Remove anything that might make you trip as you walk through a door, such as a raised step or threshold.  Trim any bushes or trees on the path to your home.  Use bright outdoor lighting.  Clear any walking paths of anything that might make someone trip, such as rocks or tools.  Regularly check to see if handrails are loose or broken. Make sure that both sides of any steps have handrails.  Any raised decks and porches should have guardrails on the edges.  Have any leaves, snow, or ice cleared regularly.  Use sand or salt on walking paths during winter.  Clean up any spills in your garage right away. This includes oil  or grease spills. What can I do in the bathroom?  Use night lights.  Install grab bars by the toilet and in the tub and shower. Do not use towel bars as grab bars.  Use non-skid mats or decals in the tub or shower.  If you need to sit down in the shower, use a plastic, non-slip stool.  Keep the floor dry. Clean up any water that spills on the floor as soon as it happens.  Remove soap buildup in the tub or shower regularly.  Attach bath mats securely with double-sided non-slip rug tape.  Do not have throw rugs and other things on the floor that can make you trip. What can I do in the bedroom?  Use night lights.  Make sure that you have a light by your bed that is easy to reach.  Do not use any sheets or blankets that are too big for your bed. They should not hang down onto the floor.  Have a firm chair that has side arms. You can use this for support while you get dressed.  Do not have throw rugs and other things on the floor that can make you trip. What can I do in the kitchen?  Clean up any spills right away.  Avoid walking on wet floors.  Keep items that you use a lot in easy-to-reach places.  If you need to reach something above you, use a strong step stool that has a grab bar.  Keep electrical cords out of the way.  Do not use floor polish or wax that makes floors slippery. If you must use wax, use non-skid floor wax.  Do not have throw rugs and other things on the floor that can make you trip. What can I do with my stairs?  Do not leave any items on the stairs.  Make sure that there are handrails on both sides of the stairs and use them. Fix handrails that are broken or loose. Make sure that handrails are as long as the stairways.  Check any carpeting to make sure that it is firmly attached to the stairs. Fix any carpet that is loose or worn.  Avoid having throw rugs at the top or bottom of the stairs. If you do have throw rugs, attach them to the floor with  carpet tape.  Make sure that you have a light switch at the top of the stairs and the bottom of the stairs. If you do not have them, ask someone to add them for you. What else can I do to help prevent falls?  Wear shoes that:  Do not have high heels.  Have rubber bottoms.  Are comfortable and fit you well.  Are closed at the toe. Do not wear sandals.  If you use a stepladder:  Make sure that it is fully opened. Do not climb a closed stepladder.  Make sure that both sides of the stepladder are locked into place.  Ask someone to hold it for you, if possible.  Clearly mark and make sure that you can see:  Any grab bars or handrails.  First and last steps.  Where the edge of each step is.  Use tools that help you move around (mobility aids) if they are needed. These include:  Canes.  Walkers.  Scooters.  Crutches.  Turn on the lights when you go into a dark area. Replace any light bulbs as soon as they burn out.  Set up your furniture so you have a clear path. Avoid moving your furniture around.  If any of your floors are uneven, fix them.  If there are any pets around you, be aware of where they are.  Review your medicines with your doctor. Some medicines can make you feel dizzy. This can increase your chance of falling. Ask your doctor what other things that you can do to help prevent falls. This information is not intended to replace advice given to you by your health care provider. Make sure you discuss any questions you have with your health care provider. Document Released: 05/29/2009 Document Revised: 01/08/2016 Document Reviewed: 09/06/2014 Elsevier Interactive Patient Education  2017 [employer].

## 2018-01-13 NOTE — Progress Notes (Addendum)
Subjective:   Lee Harvey is a 67 y.o. male who presents for Medicare Annual/Subsequent preventive examination.  Review of Systems:  N/A Cardiac Risk Factors include: advanced age (>22men, >74 women);dyslipidemia;obesity (BMI >30kg/m2);male gender     Objective:    Vitals: BP 138/68 (BP Location: Left Arm, Patient Position: Sitting, Cuff Size: Normal)   Pulse 66   Temp 98 F (36.7 C) (Oral)   Resp 12   Ht 5\' 10"  (1.778 m)   Wt 221 lb 14.4 oz (100.7 kg)   SpO2 93%   BMI 31.84 kg/m   Body mass index is 31.84 kg/m.  Advanced Directives 01/13/2018 05/23/2017 11/30/2016 11/22/2016 10/20/2016 04/15/2016 04/14/2015  Does Patient Have a Medical Advance Directive? No HCPOA;Yes LW No Yes Yes Yes No No  Type of Advance Directive Living will - Living will Living will Living will - -  Does patient want to make changes to medical advance directive? Yes (MAU/Ambulatory/Procedural Areas - Information given) - No - Patient declined - - - -    Tobacco Social History   Tobacco Use  Smoking Status Former Smoker  . Packs/day: 0.50  . Years: 20.00  . Pack years: 10.00  . Types: Cigarettes  . Start date: 08/16/1986  . Last attempt to quit: 08/16/2006  . Years since quitting: 11.4  Smokeless Tobacco Never Used  Tobacco Comment   smoking cessation materials not required     Counseling given: No Comment: smoking cessation materials not required  Clinical Intake:  Pre-visit preparation completed: Yes  Pain : No/denies pain   BMI - recorded: 31.84 Nutritional Status: BMI > 30  Obese Nutritional Risks: None Diabetes: No  How often do you need to have someone help you when you read instructions, pamphlets, or other written materials from your doctor or pharmacy?: 1 - Never  Interpreter Needed?: No  Information entered by :: AEversole, LPN  Hospitalizations/ED visits and surgeries occurring within the previous 12 months:  Within the previous 12 months, pt has not underwent any surgical  procedures, has not been hospitalized for any conditions and has not been treated by an emergency room clinician.  Past Medical History:  Diagnosis Date  . Arthritis    KNEE RIGHT  . GERD (gastroesophageal reflux disease)    OCC  . History of hemorrhoids   . HL (hearing loss)   . Hyperglycemia   . Left inguinal hernia   . Tremors of nervous system   . Trigger finger    Past Surgical History:  Procedure Laterality Date  . CATARACT EXTRACTION, BILATERAL    . COLONOSCOPY  2015  . INGUINAL HERNIA REPAIR Left 11/30/2016   Procedure: HERNIA REPAIR INGUINAL ADULT;  Surgeon: Christene Lye, MD;  Location: [facility] ORS;  Service: General;  Laterality: Left;   Family History  Problem Relation Age of Onset  . Dementia Mother   . Cancer Father 51       Colon  . Thyroid disease Sister   . Mesothelioma Brother    Social History   Socioeconomic History  . Marital status: Single    Spouse name: Not on file  . Number of children: 0  . Years of education: Not on file  . Highest education level: 12th grade  Occupational History  . Occupation: Retired  Scientific laboratory technician  . Financial resource strain: Not hard at all  . Food insecurity:    Worry: Never true    Inability: Never true  . Transportation needs:    Medical: No  Non-medical: No  Tobacco Use  . Smoking status: Former Smoker    Packs/day: 0.50    Years: 20.00    Pack years: 10.00    Types: Cigarettes    Start date: 08/16/1986    Last attempt to quit: 08/16/2006    Years since quitting: 11.4  . Smokeless tobacco: Never Used  . Tobacco comment: smoking cessation materials not required  Substance and Sexual Activity  . Alcohol use: No    Alcohol/week: 0.0 oz  . Drug use: No  . Sexual activity: Not Currently    Partners: Female  Lifestyle  . Physical activity:    Days per week: 7 days    Minutes per session: 30 min  . Stress: Not at all  Relationships  . Social connections:    Talks on phone: Patient refused    Gets  together: Patient refused    Attends religious service: Patient refused    Active member of club or organization: Patient refused    Attends meetings of clubs or organizations: Patient refused    Relationship status: Patient refused  Other Topics Concern  . Not on file  Social History Narrative   He lives alone, brother lives in [place] in an assistance living facility.    He was married twice but never had children   He is very close to his sister, but she lives in [place].    He sees his nephew on a regular basis.    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 acetaminophen (TYLENOL) 500 MG tablet Take 1 tablet (500 mg total) by mouth every 6 (six) hours as needed. (Patient taking differently: Take 500 mg by mouth 2 (two) times daily. )  . aspirin EC 81 MG tablet Take 1 tablet (81 mg total) by mouth daily.  Marland Kitchen atorvastatin (LIPITOR) 40 MG tablet Take 1 tablet (40 mg total) by mouth daily.  . calcium carbonate (TUMS - DOSED IN MG ELEMENTAL CALCIUM) 500 MG chewable tablet Chew 1 tablet by mouth as needed for indigestion or heartburn.  . MULTIPLE VITAMIN PO Take 1 tablet by mouth daily.   No facility-administered encounter medications on file as of 01/13/2018.     Activities of Daily Living In your present state of health, do you have any difficulty performing the following activities: 01/13/2018  Hearing? N  Comment denies hearing aids  Vision? N  Comment wears eyeglasses  Difficulty concentrating or making decisions? N  Walking or climbing stairs? Y  Comment dyspnea  Dressing or bathing? N  Doing errands, shopping? N  Preparing Food and eating ? N  Comment upper partial dentures  Using the Toilet? N  In the past six months, have you accidently leaked urine? N  Do you have problems with loss of bowel control? N  Managing your Medications? N  Managing your Finances? N  Housekeeping or managing your  Housekeeping? N  Some recent data might be hidden    Patient Care Team: Steele Sizer, MD as PCP - General (Family Medicine) Lorelee Cover., MD as Consulting Physician (Ophthalmology)   Assessment:   This is a routine wellness examination for Lee Harvey.  Exercise Activities and Dietary recommendations Current Exercise Habits: Home exercise routine, Type of exercise: walking, Time (Minutes): 30, Frequency (Times/Week): 7, Weekly Exercise (Minutes/Week): 210, Intensity: Mild, Exercise limited by: None identified  Goals    . DIET - INCREASE WATER INTAKE  Recommend to drink at least 6-8 8oz glasses of water per day.       Fall Risk Fall Risk  01/13/2018 05/23/2017 10/20/2016 04/15/2016 04/14/2015  Falls in the past year? No No No No No  Risk for fall due to : Impaired vision - - - -  Risk for fall due to: Comment wears eyeglasses - - - -   FALL RISK PREVENTION PERTAINING TO HOME: Is your home free of loose throw rugs in walkways, pet beds, electrical cords, etc? Yes Is there adequate lighting in your home to reduce risk of falls?  Yes Are there stairs in or around your home WITH handrails? Yes  ASSISTIVE DEVICES UTILIZED TO PREVENT FALLS: Use of a cane, walker or w/c? No Grab bars in the bathroom? No  Shower chair or a place to sit while bathing? No An elevated toilet seat or a handicapped toilet? No  Timed Get Up and Go Performed: Yes. Pt ambulated 10 feet within 8 sec. Gait stead-fast and without the use of an assistive device. No intervention required at this time. Fall risk prevention has been discussed.  Community Resource Referral:  Pt declined my offer to send [employer] Referral to Care Guide for installation of grab bars in the shower, shower chair or an elevated toilet seat.  Depression Screen PHQ 2/9 Scores 05/23/2017 10/20/2016 04/15/2016 04/14/2015  PHQ - 2 Score 0 0 0 0    Cognitive Function     6CIT Screen 01/13/2018  What Year? 0 points  What month? 0  points  What time? 0 points  Count back from 20 0 points  Months in reverse 0 points  Repeat phrase 6 points  Total Score 6    Immunization History  Administered Date(s) Administered  . Influenza, Seasonal, Injecte, Preservative Fre 08/18/2011  . Influenza,inj,Quad PF,6+ Mos 04/11/2014, 04/14/2015, 04/15/2016, 05/23/2017  . Influenza-Unspecified 04/11/2014  . Pneumococcal Conjugate-13 01/13/2018  . Tdap 04/11/2014  . Zoster 04/15/2016    Qualifies for Shingles Vaccine? Yes. Zostavax completed 04/15/16. Due for Shingrix. Education has been provided regarding the importance of this vaccine. Pt has been advised to call his insurance company to determine his out of pocket expense. Advised he may also receive this vaccine at his local pharmacy or Health Dept. Verbalized acceptance and understanding.  Screening Tests Health Maintenance  Topic Date Due  . HIV Screening  08/10/2029 (Originally 06/15/1966)  . INFLUENZA VACCINE  03/16/2018  . PNA vac Low Risk Adult (2 of 2 - PPSV23) 01/14/2019  . TETANUS/TDAP  04/11/2024  . COLONOSCOPY  05/23/2024  . Hepatitis C Screening  Completed   Cancer Screenings: Lung: Low Dose CT Chest recommended if Age 32-80 years, 30 pack-year currently smoking OR have quit w/in 15years. Patient does not qualify. Colorectal: Completed 05/23/14. Repeat ever 10 years  Additional Screenings: Hepatitis C Screening: Completed 04/12/13     Plan:  I have personally reviewed and addressed the Medicare Annual Wellness questionnaire and have noted the following in the patient's chart:  A. Medical and social history B. Use of alcohol, tobacco or illicit drugs  C. Current medications and supplements D. Functional ability and status E.  Nutritional status F.  Physical activity G. Advance directives H. List of other physicians I.  Hospitalizations, surgeries, and ER visits in previous 12 months J.  [place] such as hearing and vision if needed, cognitive  and depression L. Referrals and appointments  In addition, I have reviewed and discussed with patient certain preventive protocols,  quality metrics, and best practice recommendations. A written personalized care plan for preventive services as well as general preventive health recommendations were provided to patient.  See attached scanned questionnaire for additional information.   Signed,  Aleatha Borer, LPN Nurse Health Advisor  I have reviewed this encounter including the documentation in this note and/or discussed this patient with the provider, Aleatha Borer, LPN. I am certifying that I agree with the content of this note as supervising physician.  Steele Sizer, MD [facility] 01/13/2018, 10:24 AM

## 2018-01-30 ENCOUNTER — Encounter: Payer: Self-pay | Admitting: Family Medicine

## 2018-01-30 ENCOUNTER — Ambulatory Visit (INDEPENDENT_AMBULATORY_CARE_PROVIDER_SITE_OTHER): Payer: PPO | Admitting: Family Medicine

## 2018-01-30 ENCOUNTER — Encounter

## 2018-01-30 VITALS — BP 136/70 | HR 77 | Temp 98.5°F | Resp 16 | Ht 70.0 in | Wt 223.2 lb

## 2018-01-30 DIAGNOSIS — R739 Hyperglycemia, unspecified: Secondary | ICD-10-CM | POA: Diagnosis not present

## 2018-01-30 DIAGNOSIS — E7800 Pure hypercholesterolemia, unspecified: Secondary | ICD-10-CM | POA: Diagnosis not present

## 2018-01-30 DIAGNOSIS — Z1211 Encounter for screening for malignant neoplasm of colon: Secondary | ICD-10-CM

## 2018-01-30 DIAGNOSIS — H9193 Unspecified hearing loss, bilateral: Secondary | ICD-10-CM | POA: Diagnosis not present

## 2018-01-30 DIAGNOSIS — Z87891 Personal history of nicotine dependence: Secondary | ICD-10-CM

## 2018-01-30 DIAGNOSIS — R251 Tremor, unspecified: Secondary | ICD-10-CM

## 2018-01-30 DIAGNOSIS — Z0001 Encounter for general adult medical examination with abnormal findings: Secondary | ICD-10-CM | POA: Diagnosis not present

## 2018-01-30 DIAGNOSIS — M25561 Pain in right knee: Secondary | ICD-10-CM | POA: Diagnosis not present

## 2018-01-30 DIAGNOSIS — G8929 Other chronic pain: Secondary | ICD-10-CM | POA: Diagnosis not present

## 2018-01-30 DIAGNOSIS — Z0000 Encounter for general adult medical examination without abnormal findings: Secondary | ICD-10-CM

## 2018-01-30 NOTE — Patient Instructions (Signed)
Preventive Care 65 Years and Older, Male Preventive care refers to lifestyle choices and visits with your health care provider that can promote health and wellness. What does preventive care include?  A yearly physical exam. This is also called an annual well check.  Dental exams once or twice a year.  Routine eye exams. Ask your health care provider how often you should have your eyes checked.  Personal lifestyle choices, including: ? Daily care of your teeth and gums. ? Regular physical activity. ? Eating a healthy diet. ? Avoiding tobacco and drug use. ? Limiting alcohol use. ? Practicing safe sex. ? Taking low doses of aspirin every day. ? Taking vitamin and mineral supplements as recommended by your health care provider. What happens during an annual well check? The services and screenings done by your health care provider during your annual well check will depend on your age, overall health, lifestyle risk factors, and family history of disease. Counseling Your health care provider may ask you questions about your:  Alcohol use.  Tobacco use.  Drug use.  Emotional well-being.  Home and relationship well-being.  Sexual activity.  Eating habits.  History of falls.  Memory and ability to understand (cognition).  Work and work environment.  Screening You may have the following tests or measurements:  Height, weight, and BMI.  Blood pressure.  Lipid and cholesterol levels. These may be checked every 5 years, or more frequently if you are over 50 years old.  Skin check.  Lung cancer screening. You may have this screening every year starting at age 55 if you have a 30-pack-year history of smoking and currently smoke or have quit within the past 15 years.  Fecal occult blood test (FOBT) of the stool. You may have this test every year starting at age 50.  Flexible sigmoidoscopy or colonoscopy. You may have a sigmoidoscopy every 5 years or a colonoscopy every 10  years starting at age 50.  Prostate cancer screening. Recommendations will vary depending on your family history and other risks.  Hepatitis C blood test.  Hepatitis B blood test.  Sexually transmitted disease (STD) testing.  Diabetes screening. This is done by checking your blood sugar (glucose) after you have not eaten for a while (fasting). You may have this done every 1-3 years.  Abdominal aortic aneurysm (AAA) screening. You may need this if you are a current or former smoker.  Osteoporosis. You may be screened starting at age 70 if you are at high risk.  Talk with your health care provider about your test results, treatment options, and if necessary, the need for more tests. Vaccines Your health care provider may recommend certain vaccines, such as:  Influenza vaccine. This is recommended every year.  Tetanus, diphtheria, and acellular pertussis (Tdap, Td) vaccine. You may need a Td booster every 10 years.  Varicella vaccine. You may need this if you have not been vaccinated.  Zoster vaccine. You may need this after age 60.  Measles, mumps, and rubella (MMR) vaccine. You may need at least one dose of MMR if you were born in 1957 or later. You may also need a second dose.  Pneumococcal 13-valent conjugate (PCV13) vaccine. One dose is recommended after age 65.  Pneumococcal polysaccharide (PPSV23) vaccine. One dose is recommended after age 65.  Meningococcal vaccine. You may need this if you have certain conditions.  Hepatitis A vaccine. You may need this if you have certain conditions or if you travel or work in places where you   may be exposed to hepatitis A.  Hepatitis B vaccine. You may need this if you have certain conditions or if you travel or work in places where you may be exposed to hepatitis B.  Haemophilus influenzae type b (Hib) vaccine. You may need this if you have certain risk factors.  Talk to your health care provider about which screenings and vaccines  you need and how often you need them. This information is not intended to replace advice given to you by your health care provider. Make sure you discuss any questions you have with your health care provider. Document Released: 08/29/2015 Document Revised: 04/21/2016 Document Reviewed: 06/03/2015 Elsevier Interactive Patient Education  2018 [employer]  

## 2018-01-30 NOTE — Progress Notes (Signed)
Name: Lee Harvey   MRN: [id number]    DOB: [birth year]   Date:01/30/2018       Progress Note  Subjective  Chief Complaint  Chief Complaint  Patient presents with  . Annual Exam    HPI  Preventive care: reviewed medicare wellness form, reminded him to bring back Power of attorney of health care. We will schedule cologuard and triple A screen he will check shingrix coverage with insurance  OA right knee: he only takes Tylenol occasionally, doing better, no recent effusion, pain is mild and intermittent now.   Hyperglycemia: last hgbA1C was better, no polyphagia, polydipsia or polyuria.   Hyperlipidemia: taking Atorvastatin and denies side effects of medication, due for labs, no myalgias.   Obesity: BMI above 30 with co-morbidities. He has been walking daily discussed a healthy diet. He will try to go down on soda intake.   Tremors : both hands and stable, worse when nervous.   IPSS Questionnaire (AUA-7): Over the past month.   1)  How often have you had a sensation of not emptying your bladder completely after you finish urinating?  1 - Less than 1 time in 5  2)  How often have you had to urinate again less than two hours after you finished urinating? 2 - Less than half the time  3)  How often have you found you stopped and started again several times when you urinated?  0 - Not at all  4) How difficult have you found it to postpone urination?  1 - Less than 1 time in 5  5) How often have you had a weak urinary stream?  1 - Less than 1 time in 5  6) How often have you had to push or strain to begin urination?  0 - Not at all  7) How many times did you most typically get up to urinate from the time you went to bed until the time you got up in the morning?  1 - 1 time  Total score:  0-7 mildly symptomatic  6 8-19 moderately symptomatic   20-35 severely symptomatic    Patient Active Problem List   Diagnosis Date Noted  . Right knee pain 10/20/2016  . Hyperlipidemia 04/22/2016   . Hyperglycemia 04/15/2016  . Difficulty hearing 04/13/2015  . Inguinal hernia 04/13/2015  . Tremor of both hands 04/13/2015  . Excess weight 04/13/2015    Past Surgical History:  Procedure Laterality Date  . CATARACT EXTRACTION, BILATERAL    . COLONOSCOPY  2015  . INGUINAL HERNIA REPAIR Left 11/30/2016   Procedure: HERNIA REPAIR INGUINAL ADULT;  Surgeon: Christene Lye, MD;  Location: [facility] ORS;  Service: General;  Laterality: Left;    Family History  Problem Relation Age of Onset  . Dementia Mother   . Cancer Father 96       Colon  . Thyroid disease Sister   . Mesothelioma Brother   . COPD Brother     Social History   Socioeconomic History  . Marital status: Single    Spouse name: Not on file  . Number of children: 0  . Years of education: Not on file  . Highest education level: 12th grade  Occupational History  . Occupation: Retired  Scientific laboratory technician  . Financial resource strain: Not hard at all  . Food insecurity:    Worry: Never true    Inability: Never true  . Transportation needs:    Medical: No    Non-medical: No  Tobacco Use  . Smoking status: Former Smoker    Packs/day: 0.50    Years: 20.00    Pack years: 10.00    Types: Cigarettes    Start date: 08/16/1986    Last attempt to quit: 08/16/2006    Years since quitting: 11.4  . Smokeless tobacco: Never Used  . Tobacco comment: smoking cessation materials not required  Substance and Sexual Activity  . Alcohol use: No    Alcohol/week: 0.0 oz  . Drug use: No  . Sexual activity: Not Currently    Partners: Female  Lifestyle  . Physical activity:    Days per week: 7 days    Minutes per session: 30 min  . Stress: Only a little  Relationships  . Social connections:    Talks on phone: More than three times a week    Gets together: More than three times a week    Attends religious service: More than 4 times per year    Active member of club or organization: Yes    Attends meetings of clubs or  organizations: More than 4 times per year    Relationship status: Divorced  . Intimate partner violence:    Fear of current or ex partner: No    Emotionally abused: No    Physically abused: No    Forced sexual activity: No  Other Topics Concern  . Not on file  Social History Narrative   He lives alone, brother died 2018-02-27   He was married twice but never had children   He is very close to his sister, but she lives in [place]   He sees his nephew on a regular basis.    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  acetaminophen (TYLENOL) 500 MG tablet, Take 1 tablet (500 mg total) by mouth every 6 (six) hours as needed. (Patient taking differently: Take 500 mg by mouth 2 (two) times daily. ), Disp: 30 tablet, Rfl: 0 .  aspirin EC 81 MG tablet, Take 1 tablet (81 mg total) by mouth daily., Disp: 30 tablet, Rfl: 0 .  atorvastatin (LIPITOR) 40 MG tablet, Take 1 tablet (40 mg total) by mouth daily., Disp: 90 tablet, Rfl: 1 .  calcium carbonate (TUMS - DOSED IN MG ELEMENTAL CALCIUM) 500 MG chewable tablet, Chew 1 tablet by mouth as needed for indigestion or heartburn., Disp: , Rfl:  .  MULTIPLE VITAMIN PO, Take 1 tablet by mouth daily., Disp: , Rfl:   No Known Allergies   ROS  Constitutional: Negative for fever or weight change.  Respiratory: Negative for cough and shortness of breath.   Cardiovascular: Negative for chest pain or palpitations.  Gastrointestinal: Negative for abdominal pain, no bowel changes.  Musculoskeletal: Negative for gait problem or joint swelling.  Skin: Negative for rash.  Neurological: Negative for dizziness or headache.  No other specific complaints in a complete review of systems (except as listed in HPI above).  Objective  Vitals:   01/30/18 1340  BP: 136/70  Pulse: 77  Resp: 16  Temp: 98.5 F (36.9 C)  TempSrc: Oral  SpO2: 95%  Weight: 223 lb 3.2 oz (101.2 kg)  Height: 5\' 10"  (1.778 m)    Body mass index  is 32.03 kg/m.  Physical Exam  Constitutional: Patient appears well-developed and well-nourished. No distress.  HENT: Head: Normocephalic and atraumatic. Ears: B TMs ok, no erythema or effusion; Nose: Nose normal. Mouth/Throat: Oropharynx is clear and  moist. No oropharyngeal exudate.  Eyes: Conjunctivae and EOM are normal. Pupils are equal, round, and reactive to light. No scleral icterus.  Neck: Normal range of motion. Neck supple. No JVD present. No thyromegaly present.  Cardiovascular: Normal rate, regular rhythm and normal heart sounds.  No murmur heard. No BLE edema. Pulmonary/Chest: Effort normal and breath sounds normal. No respiratory distress. Abdominal: Soft. Bowel sounds are normal, no distension. There is no tenderness. no masses MALE GENITALIA: Normal descended testes bilaterally, no masses palpated, no hernias, no lesions, no discharge RECTAL: Prostate normal size and consistency, no rectal masses or hemorrhoids Musculoskeletal: Normal range of motion, no joint effusions. No gross deformities Neurological: he is alert and oriented to person, place, and time. No cranial nerve deficit. Coordination, balance, strength, speech and gait are normal.  Skin: Skin is warm and dry. No rash noted. No erythema.  Psychiatric: Patient has a normal mood and affect. behavior is normal. Judgment and thought content normal.  PHQ2/9: Depression screen [facility] 2/9 01/30/2018 05/23/2017 10/20/2016 04/15/2016 04/14/2015  Decreased Interest 0 0 0 0 0  Down, Depressed, Hopeless 1 0 0 0 0  PHQ - 2 Score 1 0 0 0 0  Altered sleeping 0 - - - -  Tired, decreased energy 0 - - - -  Change in appetite 0 - - - -  Feeling bad or failure about yourself  0 - - - -  Trouble concentrating 0 - - - -  Moving slowly or fidgety/restless 0 - - - -  Suicidal thoughts 0 - - - -  PHQ-9 Score 1 - - - -  Difficult doing work/chores Not difficult at all - - - -     Fall Risk: Fall Risk  01/30/2018 01/13/2018 05/23/2017  10/20/2016 04/15/2016  Falls in the past year? No No No No No  Risk for fall due to : - Impaired vision - - -  Risk for fall due to: Comment - wears eyeglasses - - -     Functional Status Survey: Is the patient deaf or have difficulty hearing?: No Does the patient have difficulty seeing, even when wearing glasses/contacts?: No Does the patient have difficulty concentrating, remembering, or making decisions?: No Does the patient have difficulty walking or climbing stairs?: No Does the patient have difficulty dressing or bathing?: No Does the patient have difficulty doing errands alone such as visiting a doctor's office or shopping?: No    Assessment & Plan  1. Encounter for preventive care  Discussed importance of 150 minutes of physical activity weekly, eat two servings of fish weekly, eat one serving of tree nuts ( cashews, pistachios, pecans, almonds.Marland Kitchen) every other day, eat 6 servings of fruit/vegetables daily and drink plenty of water and avoid sweet beverages.   Calculated ASCVD and aspirin use: 9.7% continue aspirin   2. Colon cancer screening  - Cologuard  3. History of tobacco use  - VAS [place] AAA DUPLEX; Future  4. Morbid obesity ([place])  Based on BMI above 30 with hyperlipidemia, OA knee and hyperglycemia, needs to lose weight Discussed with the patient the risk posed by an increased BMI. Discussed importance of portion control, calorie counting and at least 150 minutes of physical activity weekly. Avoid sweet beverages and drink more water. Eat at least 6 servings of fruit and vegetables daily   5. Pure hypercholesterolemia  - COMPLETE METABOLIC PANEL WITH GFR - Lipid panel  6. Hyperglycemia  - Hemoglobin A1c  7. Bilateral hearing loss, unspecified hearing loss type  He states not bothering him much   8. Chronic pain of right knee  Better since he quit working and not standing on concrete all day  9. Tremor of both hands  Stable

## 2018-03-23 DIAGNOSIS — Z1211 Encounter for screening for malignant neoplasm of colon: Secondary | ICD-10-CM | POA: Diagnosis not present

## 2018-04-01 LAB — COLOGUARD: Cologuard: NEGATIVE

## 2018-04-03 ENCOUNTER — Telehealth: Payer: Self-pay

## 2018-04-03 ENCOUNTER — Encounter: Payer: Self-pay | Admitting: Family Medicine

## 2018-04-03 NOTE — Telephone Encounter (Signed)
Left message for patient to return our call regarding results. Inform patient his Cologuard results are Negative.

## 2018-04-03 NOTE — Telephone Encounter (Signed)
Pt. Given Cologuard results. Verbalizes understanding.

## 2018-05-16 LAB — COMPLETE METABOLIC PANEL WITH GFR
AG Ratio: 2 (calc) (ref 1.0–2.5)
ALKALINE PHOSPHATASE (APISO): 114 U/L (ref 40–115)
ALT: 32 U/L (ref 9–46)
AST: 23 U/L (ref 10–35)
Albumin: 4.5 g/dL (ref 3.6–5.1)
BUN: 18 mg/dL (ref 7–25)
CALCIUM: 9 mg/dL (ref 8.6–10.3)
CO2: 24 mmol/L (ref 20–32)
CREATININE: 0.98 mg/dL (ref 0.70–1.25)
Chloride: 105 mmol/L (ref 98–110)
GFR, EST NON AFRICAN AMERICAN: 81 mL/min/{1.73_m2} (ref >=60)
GFR, Est African American: 93 mL/min/{1.73_m2} (ref >=60)
GLUCOSE: 90 mg/dL (ref 65–99)
Globulin: 2.2 g/dL (calc) (ref 1.9–3.7)
Potassium: 4.1 mmol/L (ref 3.5–5.3)
Sodium: 139 mmol/L (ref 135–146)
Total Bilirubin: 0.9 mg/dL (ref 0.2–1.2)
Total Protein: 6.7 g/dL (ref 6.1–8.1)

## 2018-05-16 LAB — LIPID PANEL
CHOL/HDL RATIO: 2.7 (calc) (ref <5.0)
CHOLESTEROL: 137 mg/dL (ref <200)
HDL: 50 mg/dL (ref >40)
LDL CHOLESTEROL (CALC): 69 mg/dL
Non-HDL Cholesterol (Calc): 87 mg/dL (calc) (ref <130)
TRIGLYCERIDES: 97 mg/dL (ref <150)

## 2018-05-16 LAB — HEMOGLOBIN A1C
EAG (MMOL/L): 6.5 (calc)
Hgb A1c MFr Bld: 5.7 % of total Hgb — ABNORMAL HIGH (ref <5.7)
Mean Plasma Glucose: 117 (calc)

## 2018-06-10 ENCOUNTER — Other Ambulatory Visit: Payer: Self-pay | Admitting: Family Medicine

## 2018-06-10 DIAGNOSIS — E7800 Pure hypercholesterolemia, unspecified: Secondary | ICD-10-CM

## 2018-08-01 ENCOUNTER — Ambulatory Visit (INDEPENDENT_AMBULATORY_CARE_PROVIDER_SITE_OTHER): Payer: PPO | Admitting: Family Medicine

## 2018-08-01 ENCOUNTER — Encounter: Payer: Self-pay | Admitting: Family Medicine

## 2018-08-01 VITALS — BP 130/80 | HR 66 | Temp 98.4°F | Resp 16 | Ht 70.0 in | Wt 228.5 lb

## 2018-08-01 DIAGNOSIS — E7800 Pure hypercholesterolemia, unspecified: Secondary | ICD-10-CM

## 2018-08-01 DIAGNOSIS — Z23 Encounter for immunization: Secondary | ICD-10-CM

## 2018-08-01 DIAGNOSIS — G8929 Other chronic pain: Secondary | ICD-10-CM | POA: Diagnosis not present

## 2018-08-01 DIAGNOSIS — R251 Tremor, unspecified: Secondary | ICD-10-CM

## 2018-08-01 DIAGNOSIS — R7303 Prediabetes: Secondary | ICD-10-CM

## 2018-08-01 DIAGNOSIS — M25561 Pain in right knee: Secondary | ICD-10-CM

## 2018-08-01 MED ORDER — ATORVASTATIN CALCIUM 40 MG PO TABS: 40.0000 mg | ORAL_TABLET | Freq: Every day | ORAL | 1 refills | Status: DC

## 2018-08-01 NOTE — Progress Notes (Signed)
Name: Lee Harvey   MRN: [id number]    DOB: 1950-12-23   Date:08/02/2018       Progress Note  Subjective  Chief Complaint  Chief Complaint  Patient presents with  . Hyperglycemia  . Hyperlipidemia    HPI  OA right knee: he only takes Tylenol occasionally, doing better, no recent effusion, pain is mild and intermittent now. He feels a popping sensation occasionally, no fall but sometimes feels unstable. Discussed home exercises versus PT , he will try home exercises. He states symptoms triggered by cold and rainy weather   Pre-diabetes : last hgbA1C was 5.7%  no polyphagia, polydipsia or polyuria. Discussed in length importance of life style modification to prevent progression to diabetes   Hyperlipidemia: taking Atorvastatin and denies side effects of medication, last labs showed LDL at goal at 69.   Obesity: BMI above 30 with co-morbidities. He gained 5 lbs since last visit, he has been walking 2-4 miles 5 days a week  and playing disc golf, but his diet is not very good still drinking sweet tea and sodas, but he will try to change it   Tremors : both hands and stable, worse when nervous. Not constant and stable    Patient Active Problem List   Diagnosis Date Noted  . Right knee pain 10/20/2016  . Hyperlipidemia 04/22/2016  . Hyperglycemia 04/15/2016  . Difficulty hearing 04/13/2015  . Inguinal hernia 04/13/2015  . Tremor of both hands 04/13/2015  . Excess weight 04/13/2015    Past Surgical History:  Procedure Laterality Date  . CATARACT EXTRACTION, BILATERAL    . COLONOSCOPY  2015  . INGUINAL HERNIA REPAIR Left 11/30/2016   Procedure: HERNIA REPAIR INGUINAL ADULT;  Surgeon: Christene Lye, MD;  Location: [facility] ORS;  Service: General;  Laterality: Left;    Family History  Problem Relation Age of Onset  . Dementia Mother   . Cancer Father 71       Colon  . Thyroid disease Sister   . Mesothelioma Brother   . COPD Brother     Social History    Socioeconomic History  . Marital status: Single    Spouse name: Not on file  . Number of children: 0  . Years of education: Not on file  . Highest education level: 12th grade  Occupational History  . Occupation: Retired  Scientific laboratory technician  . Financial resource strain: Not hard at all  . Food insecurity:    Worry: Never true    Inability: Never true  . Transportation needs:    Medical: No    Non-medical: No  Tobacco Use  . Smoking status: Former Smoker    Packs/day: 0.50    Years: 20.00    Pack years: 10.00    Types: Cigarettes    Start date: 08/16/1986    Last attempt to quit: 08/16/2006    Years since quitting: 11.9  . Smokeless tobacco: Never Used  . Tobacco comment: smoking cessation materials not required  Substance and Sexual Activity  . Alcohol use: No    Alcohol/week: 0.0 standard drinks  . Drug use: No  . Sexual activity: Not Currently    Partners: Female  Lifestyle  . Physical activity:    Days per week: 7 days    Minutes per session: 30 min  . Stress: Only a little  Relationships  . Social connections:    Talks on phone: More than three times a week    Gets together: More than three times  a week    Attends religious service: More than 4 times per year    Active member of club or organization: Yes    Attends meetings of clubs or organizations: More than 4 times per year    Relationship status: Divorced  . Intimate partner violence:    Fear of current or ex partner: No    Emotionally abused: No    Physically abused: No    Forced sexual activity: No  Other Topics Concern  . Not on file  Social History Narrative   He lives alone, brother died 02-12-18   He was married twice but never had children   He is very close to his sister, but she lives in [place]   He sees his nephew on a regular basis.    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  acetaminophen (TYLENOL) 500 MG tablet, Take 1 tablet (500 mg total) by mouth  every 6 (six) hours as needed. (Patient taking differently: Take 500 mg by mouth 2 (two) times daily. ), Disp: 30 tablet, Rfl: 0 .  aspirin EC 81 MG tablet, Take 1 tablet (81 mg total) by mouth daily., Disp: 30 tablet, Rfl: 0 .  atorvastatin (LIPITOR) 40 MG tablet, Take 1 tablet (40 mg total) by mouth daily., Disp: 90 tablet, Rfl: 1 .  calcium carbonate (TUMS - DOSED IN MG ELEMENTAL CALCIUM) 500 MG chewable tablet, Chew 1 tablet by mouth as needed for indigestion or heartburn., Disp: , Rfl:  .  MULTIPLE VITAMIN PO, Take 1 tablet by mouth daily., Disp: , Rfl:   No Known Allergies  I personally reviewed active problem list, medication list, allergies, family history, social history with the patient/caregiver today.   ROS  Constitutional: Negative for fever, positive for mild weight change.  Respiratory: Negative for cough and shortness of breath.   Cardiovascular: Negative for chest pain or palpitations.  Gastrointestinal: Negative for abdominal pain, no bowel changes.  Musculoskeletal: positive  for gait problem but no joint swelling.  Skin: Negative for rash.  Neurological: Negative for dizziness or headache.  No other specific complaints in a complete review of systems (except as listed in HPI above).  Objective  Vitals:   08/01/18 0752  BP: 130/80  Pulse: 66  Resp: 16  Temp: 98.4 F (36.9 C)  TempSrc: Oral  SpO2: 97%  Weight: 228 lb 8 oz (103.6 kg)  Height: 5\' 10"  (1.778 m)    Body mass index is 32.79 kg/m.  Physical Exam  Constitutional: Patient appears well-developed and well-nourished. Obese  No distress.  HEENT: head atraumatic, normocephalic, pupils equal and reactive to light,  neck supple, throat within normal limits Cardiovascular: Normal rate, regular rhythm and normal heart sounds.  No murmur heard. No BLE edema. Pulmonary/Chest: Effort normal and breath sounds normal. No respiratory distress. Abdominal: Soft.  There is no tenderness. Muscular Skeletal: no  effusion , some crepitus with extension of both knees Psychiatric: Patient has a normal mood and affect. behavior is normal. Judgment and thought content normal.  PHQ2/9: Depression screen [facility] 2/9 08/01/2018 01/30/2018 05/23/2017 10/20/2016 04/15/2016  Decreased Interest 0 0 0 0 0  Down, Depressed, Hopeless 0 1 0 0 0  PHQ - 2 Score 0 1 0 0 0  Altered sleeping 0 0 - - -  Tired, decreased energy 0 0 - - -  Change in appetite 0 0 - - -  Feeling bad or failure about yourself  0 0 - - -  Trouble concentrating 0 0 - - -  Moving slowly or fidgety/restless 0 0 - - -  Suicidal thoughts 0 0 - - -  PHQ-9 Score 0 1 - - -  Difficult doing work/chores - Not difficult at all - - -     Fall Risk: Fall Risk  08/01/2018 01/30/2018 01/13/2018 05/23/2017 10/20/2016  Falls in the past year? 0 No No No No  Risk for fall due to : - - Impaired vision - -  Risk for fall due to: Comment - - wears eyeglasses - -     Assessment & Plan  1. Pure hypercholesterolemia  - atorvastatin (LIPITOR) 40 MG tablet; Take 1 tablet (40 mg total) by mouth daily.  Dispense: 90 tablet; Refill: 1  2. Flu vaccine need  - Flu vaccine HIGH DOSE PF  3. Chronic pain of right knee  From OA, on Tylenol   4. Tremor of both hands  Stable   5. Pre-diabetes  Discussed life style modification

## 2018-08-01 NOTE — Patient Instructions (Addendum)
Try un-sweet tea, avoid gatorade and try Propel instead Try to cut down on sweets in general and starches such as white potatoes, bread and pasta Brown rice instead of white rice Sweet potato instead of white potato Wheat pasta instead of regular pasta   Knee Exercises Ask your health care provider which exercises are safe for you. Do exercises exactly as told by your health care provider and adjust them as directed. It is normal to feel mild stretching, pulling, tightness, or discomfort as you do these exercises, but you should stop right away if you feel sudden pain or your pain gets worse.Do not begin these exercises until told by your health care provider. STRETCHING AND RANGE OF MOTION EXERCISES These exercises warm up your muscles and joints and improve the movement and flexibility of your knee. These exercises also help to relieve pain, numbness, and tingling. Exercise A: Knee Extension, Prone 1. Lie on your abdomen on a bed. 2. Place your left / right knee just beyond the edge of the surface so your knee is not on the bed. You can put a towel under your left / right thigh just above your knee for comfort. 3. Relax your leg muscles and allow gravity to straighten your knee. You should feel a stretch behind your left / right knee. 4. Hold this position for __________ seconds. 5. Scoot up so your knee is supported between repetitions. Repeat __________ times. Complete this stretch __________ times a day. Exercise B: Knee Flexion, Active  1. Lie on your back with both knees straight. If this causes back discomfort, bend your left / right knee so your foot is flat on the floor. 2. Slowly slide your left / right heel back toward your buttocks until you feel a gentle stretch in the front of your knee or thigh. 3. Hold this position for __________ seconds. 4. Slowly slide your left / right heel back to the starting position. Repeat __________ times. Complete this exercise __________ times a  day. Exercise C: Quadriceps, Prone  1. Lie on your abdomen on a firm surface, such as a bed or padded floor. 2. Bend your left / right knee and hold your ankle. If you cannot reach your ankle or pant leg, loop a belt around your foot and grab the belt instead. 3. Gently pull your heel toward your buttocks. Your knee should not slide out to the side. You should feel a stretch in the front of your thigh and knee. 4. Hold this position for __________ seconds. Repeat __________ times. Complete this stretch __________ times a day. Exercise D: Hamstring, Supine 1. Lie on your back. 2. Loop a belt or towel over the ball of your left / right foot. The ball of your foot is on the walking surface, right under your toes. 3. Straighten your left / right knee and slowly pull on the belt to raise your leg until you feel a gentle stretch behind your knee. ? Do not let your left / right knee bend while you do this. ? Keep your other leg flat on the floor. 4. Hold this position for __________ seconds. Repeat __________ times. Complete this stretch __________ times a day. STRENGTHENING EXERCISES These exercises build strength and endurance in your knee. Endurance is the ability to use your muscles for a long time, even after they get tired. Exercise E: Quadriceps, Isometric  1. Lie on your back with your left / right leg extended and your other knee bent. Put a rolled towel  or small pillow under your knee if told by your health care provider. 2. Slowly tense the muscles in the front of your left / right thigh. You should see your kneecap slide up toward your hip or see increased dimpling just above the knee. This motion will push the back of the knee toward the floor. 3. For __________ seconds, keep the muscle as tight as you can without increasing your pain. 4. Relax the muscles slowly and completely. Repeat __________ times. Complete this exercise __________ times a day. Exercise F: Straight Leg Raises -  Quadriceps 1. Lie on your back with your left / right leg extended and your other knee bent. 2. Tense the muscles in the front of your left / right thigh. You should see your kneecap slide up or see increased dimpling just above the knee. Your thigh may even shake a bit. 3. Keep these muscles tight as you raise your leg 4-6 inches (10-15 cm) off the floor. Do not let your knee bend. 4. Hold this position for __________ seconds. 5. Keep these muscles tense as you lower your leg. 6. Relax your muscles slowly and completely after each repetition. Repeat __________ times. Complete this exercise __________ times a day. Exercise G: Hamstring, Isometric 1. Lie on your back on a firm surface. 2. Bend your left / right knee approximately __________ degrees. 3. Dig your left / right heel into the surface as if you are trying to pull it toward your buttocks. Tighten the muscles in the back of your thighs to dig as hard as you can without increasing any pain. 4. Hold this position for __________ seconds. 5. Release the tension gradually and allow your muscles to relax completely for __________ seconds after each repetition. Repeat __________ times. Complete this exercise __________ times a day. Exercise H: Hamstring Curls  If told by your health care provider, do this exercise while wearing ankle weights. Begin with __________ weights. Then increase the weight by 1 lb (0.5 kg) increments. Do not wear ankle weights that are more than __________. 1. Lie on your abdomen with your legs straight. 2. Bend your left / right knee as far as you can without feeling pain. Keep your hips flat against the floor. 3. Hold this position for __________ seconds. 4. Slowly lower your leg to the starting position.  Repeat __________ times. Complete this exercise __________ times a day. Exercise I: Squats (Quadriceps) 1. Stand in front of a table, with your feet and knees pointing straight ahead. You may rest your hands on  the table for balance but not for support. 2. Slowly bend your knees and lower your hips like you are going to sit in a chair. ? Keep your weight over your heels, not over your toes. ? Keep your lower legs upright so they are parallel with the table legs. ? Do not let your hips go lower than your knees. ? Do not bend lower than told by your health care provider. ? If your knee pain increases, do not bend as low. 3. Hold the squat position for __________ seconds. 4. Slowly push with your legs to return to standing. Do not use your hands to pull yourself to standing. Repeat __________ times. Complete this exercise __________ times a day. Exercise J: Wall Slides (Quadriceps)  1. Lean your back against a smooth wall or door while you walk your feet out 18-24 inches (46-61 cm) from it. 2. Place your feet hip-width apart. 3. Slowly slide down the wall or  door until your knees bend __________ degrees. Keep your knees over your heels, not over your toes. Keep your knees in line with your hips. 4. Hold for __________ seconds. Repeat __________ times. Complete this exercise __________ times a day. Exercise K: Straight Leg Raises - Hip Abductors 1. Lie on your side with your left / right leg in the top position. Lie so your head, shoulder, knee, and hip line up. You may bend your bottom knee to help you keep your balance. 2. Roll your hips slightly forward so your hips are stacked directly over each other and your left / right knee is facing forward. 3. Leading with your heel, lift your top leg 4-6 inches (10-15 cm). You should feel the muscles in your outer hip lifting. ? Do not let your foot drift forward. ? Do not let your knee roll toward the ceiling. 4. Hold this position for __________ seconds. 5. Slowly return your leg to the starting position. 6. Let your muscles relax completely after each repetition. Repeat __________ times. Complete this exercise __________ times a day. Exercise L:  Straight Leg Raises - Hip Extensors 1. Lie on your abdomen on a firm surface. You can put a pillow under your hips if that is more comfortable. 2. Tense the muscles in your buttocks and lift your left / right leg about 4-6 inches (10-15 cm). Keep your knee straight as you lift your leg. 3. Hold this position for __________ seconds. 4. Slowly lower your leg to the starting position. 5. Let your leg relax completely after each repetition. Repeat __________ times. Complete this exercise __________ times a day. This information is not intended to replace advice given to you by your health care provider. Make sure you discuss any questions you have with your health care provider. Document Released: 06/16/2005 Document Revised: 04/26/2016 Document Reviewed: 06/08/2015 Elsevier Interactive Patient Education  2018 [employer].

## 2018-08-04 ENCOUNTER — Ambulatory Visit
Admission: RE | Admit: 2018-08-04 | Discharge: 2018-08-04 | Disposition: A | Discharge status: Home / Self Care | Payer: PPO | Source: Ambulatory Visit | Attending: Family Medicine | Admitting: Family Medicine

## 2018-08-04 DIAGNOSIS — Z87891 Personal history of nicotine dependence: Secondary | ICD-10-CM | POA: Diagnosis not present

## 2018-08-04 DIAGNOSIS — I77819 Aortic ectasia, unspecified site: Secondary | ICD-10-CM | POA: Insufficient documentation

## 2018-08-04 DIAGNOSIS — I700 Atherosclerosis of aorta: Secondary | ICD-10-CM | POA: Insufficient documentation

## 2018-08-04 DIAGNOSIS — Z136 Encounter for screening for cardiovascular disorders: Secondary | ICD-10-CM | POA: Diagnosis not present

## 2018-08-06 ENCOUNTER — Encounter: Payer: Self-pay | Admitting: Family Medicine

## 2018-08-06 DIAGNOSIS — I77819 Aortic ectasia, unspecified site: Secondary | ICD-10-CM | POA: Insufficient documentation

## 2018-08-06 DIAGNOSIS — I700 Atherosclerosis of aorta: Secondary | ICD-10-CM | POA: Insufficient documentation

## 2018-10-19 DIAGNOSIS — D1039 Benign neoplasm of other parts of mouth: Secondary | ICD-10-CM | POA: Diagnosis not present

## 2019-01-18 ENCOUNTER — Other Ambulatory Visit: Payer: Self-pay

## 2019-01-18 ENCOUNTER — Ambulatory Visit (INDEPENDENT_AMBULATORY_CARE_PROVIDER_SITE_OTHER): Payer: PPO

## 2019-01-18 VITALS — BP 142/78 | HR 66 | Temp 98.1°F | Resp 15 | Ht 70.0 in | Wt 212.9 lb

## 2019-01-18 DIAGNOSIS — Z0000 Encounter for general adult medical examination without abnormal findings: Secondary | ICD-10-CM

## 2019-01-18 DIAGNOSIS — Z23 Encounter for immunization: Secondary | ICD-10-CM

## 2019-01-18 NOTE — Progress Notes (Signed)
Subjective:   Lee Harvey is a 68 y.o. male who presents for Medicare Annual/Subsequent preventive examination.  Review of Systems:   Cardiac Risk Factors include: advanced age (>47men, >69 women);dyslipidemia;male gender;obesity (BMI >30kg/m2)     Objective:    Vitals: BP (!) 142/78 (BP Location: Right Arm, Patient Position: Sitting, Cuff Size: Normal)   Pulse 66   Temp 98.1 F (36.7 C) (Oral)   Resp 15   Ht 5\' 10"  (1.778 m)   Wt 212 lb 14.4 oz (96.6 kg)   SpO2 95%   BMI 30.55 kg/m   Body mass index is 30.55 kg/m.  Advanced Directives 01/18/2019 01/13/2018 05/23/2017 11/30/2016 11/22/2016 10/20/2016 04/15/2016  Does Patient Have a Medical Advance Directive? Yes No;Yes No Yes Yes Yes No  Type of Advance Directive Healthcare Power of Attorney Living will - Living will Living will Living will -  Does patient want to make changes to medical advance directive? - Yes (MAU/Ambulatory/Procedural Areas - Information given) - No - Patient declined - - -  Copy of [place] in Chart? No - copy requested - - - - - -    Tobacco Social History   Tobacco Use  Smoking Status Former Smoker  . Packs/day: 0.50  . Years: 20.00  . Pack years: 10.00  . Types: Cigarettes  . Start date: 08/16/1986  . Last attempt to quit: 08/16/2006  . Years since quitting: 12.4  Smokeless Tobacco Never Used  Tobacco Comment   smoking cessation materials not required     Counseling given: Not Answered Comment: smoking cessation materials not required   Clinical Intake:  Pre-visit preparation completed: Yes  Pain : 0-10 Pain Score: 8  Pain Type: Acute pain Pain Location: Back Pain Orientation: Lower, Right Pain Descriptors / Indicators: Aching, Sore, Sharp Pain Onset: In the past 7 days Pain Frequency: Intermittent Pain Relieving Factors: ibuprofen, heat  Pain Relieving Factors: ibuprofen, heat  BMI - recorded: 30.55 Nutritional Status: BMI > 30  Obese Nutritional Risks: None  Diabetes: No  How often do you need to have someone help you when you read instructions, pamphlets, or other written materials from your doctor or pharmacy?: 1 - Never  Interpreter Needed?: No  Information entered by :: Clemetine Marker LPN  Past Medical History:  Diagnosis Date  . Arthritis    KNEE RIGHT  . GERD (gastroesophageal reflux disease)    OCC  . History of hemorrhoids   . HL (hearing loss)   . Hyperglycemia   . Hyperlipidemia   . Left inguinal hernia   . Tremors of nervous system   . Trigger finger    Past Surgical History:  Procedure Laterality Date  . CATARACT EXTRACTION, BILATERAL    . COLONOSCOPY  2015  . INGUINAL HERNIA REPAIR Left 11/30/2016   Procedure: HERNIA REPAIR INGUINAL ADULT;  Surgeon: Christene Lye, MD;  Location: [facility] ORS;  Service: General;  Laterality: Left;   Family History  Problem Relation Age of Onset  . Dementia Mother   . Cancer Father 83       Colon  . Thyroid disease Sister   . Mesothelioma Brother   . COPD Brother    Social History   Socioeconomic History  . Marital status: Single    Spouse name: Not on file  . Number of children: 0  . Years of education: Not on file  . Highest education level: 12th grade  Occupational History  . Occupation: Retired  Scientific laboratory technician  .  Financial resource strain: Not hard at all  . Food insecurity:    Worry: Never true    Inability: Never true  . Transportation needs:    Medical: No    Non-medical: No  Tobacco Use  . Smoking status: Former Smoker    Packs/day: 0.50    Years: 20.00    Pack years: 10.00    Types: Cigarettes    Start date: 08/16/1986    Last attempt to quit: 08/16/2006    Years since quitting: 12.4  . Smokeless tobacco: Never Used  . Tobacco comment: smoking cessation materials not required  Substance and Sexual Activity  . Alcohol use: No    Alcohol/week: 0.0 standard drinks  . Drug use: No  . Sexual activity: Not Currently    Partners: Female  Lifestyle  .  Physical activity:    Days per week: 7 days    Minutes per session: 40 min  . Stress: Not at all  Relationships  . Social connections:    Talks on phone: More than three times a week    Gets together: More than three times a week    Attends religious service: More than 4 times per year    Active member of club or organization: Yes    Attends meetings of clubs or organizations: More than 4 times per year    Relationship status: Divorced  Other Topics Concern  . Not on file  Social History Narrative   He lives alone, brother died 2018/03/03   He was married twice but never had children   He is very close to his sister, but she lives in [place]   He sees his nephew on a regular basis.    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 acetaminophen (TYLENOL) 500 MG tablet Take 1 tablet (500 mg total) by mouth every 6 (six) hours as needed. (Patient taking differently: Take 500 mg by mouth 2 (two) times daily. )  . aspirin EC 81 MG tablet Take 1 tablet (81 mg total) by mouth daily.  Marland Kitchen atorvastatin (LIPITOR) 40 MG tablet Take 1 tablet (40 mg total) by mouth daily.  . calcium carbonate (TUMS - DOSED IN MG ELEMENTAL CALCIUM) 500 MG chewable tablet Chew 1 tablet by mouth as needed for indigestion or heartburn.  Marland Kitchen ibuprofen (ADVIL) 600 MG tablet TK 1 T PO Q 6 H AFTER SURGERY FOR 5 DAYS  . MULTIPLE VITAMIN PO Take 1 tablet by mouth daily.  . traMADol (ULTRAM) 50 MG tablet TK 1 T PO Q 6 H PRF PAIN   No facility-administered encounter medications on file as of 01/18/2019.     Activities of Daily Living In your present state of health, do you have any difficulty performing the following activities: 01/18/2019 01/30/2018  Hearing? Y N  Comment no hearing aids at this time -  Vision? N N  Comment wears glasses -  Difficulty concentrating or making decisions? N N  Walking or climbing stairs? N N  Dressing or bathing? N N  Doing  errands, shopping? N N  Preparing Food and eating ? N -  Using the Toilet? N -  In the past six months, have you accidently leaked urine? N -  Do you have problems with loss of bowel control? N -  Managing your Medications? N -  Managing your Finances? N -  Housekeeping or managing your Housekeeping? N -  Some recent data might be hidden    Patient Care Team: Steele Sizer, MD as PCP - General (Family Medicine) Lorelee Cover., MD as Consulting Physician (Ophthalmology)   Assessment:   This is a routine wellness examination for Deandrew.  Exercise Activities and Dietary recommendations Current Exercise Habits: Home exercise routine, Type of exercise: walking;Other - see comments(disc golf), Time (Minutes): 40, Frequency (Times/Week): 7, Weekly Exercise (Minutes/Week): 280, Intensity: Moderate, Exercise limited by: orthopedic condition(s)(current back strain)  Goals    . DIET - INCREASE WATER INTAKE     Recommend to drink at least 6-8 8oz glasses of water per day.       Fall Risk Fall Risk  01/18/2019 08/01/2018 01/30/2018 01/13/2018 05/23/2017  Falls in the past year? 1 0 No No No  Number falls in past yr: 1 - - - -  Comment fell in a hole on golf course - - - -  Injury with Fall? 0 - - - -  Risk for fall due to : - - - Impaired vision -  Risk for fall due to: Comment - - - wears eyeglasses -  Follow up Falls prevention discussed - - - -   FALL RISK PREVENTION PERTAINING TO THE HOME:  Any stairs in or around the home? Yes  If so, do they handrails? Yes   Home free of loose throw rugs in walkways, pet beds, electrical cords, etc? Yes  Adequate lighting in your home to reduce risk of falls? Yes   ASSISTIVE DEVICES UTILIZED TO PREVENT FALLS:  Life alert? No  Use of a cane, walker or w/c? No  Grab bars in the bathroom? No  Shower chair or bench in shower? No  Elevated toilet seat or a handicapped toilet? Yes   DME ORDERS:  DME order needed?  No   TIMED UP AND GO:  Was  the test performed? Yes .  Length of time to ambulate 10 feet: 8 sec.   GAIT:  Appearance of gait: Gait slow, steady and without the use of an assistive device.   Education: Fall risk prevention has been discussed.  Intervention(s) required? No    Depression Screen PHQ 2/9 Scores 01/18/2019 08/01/2018 01/30/2018 05/23/2017  PHQ - 2 Score 0 0 1 0  PHQ- 9 Score - 0 1 -    Cognitive Function     6CIT Screen 01/18/2019 01/13/2018  What Year? 0 points 0 points  What month? 0 points 0 points  What time? 0 points 0 points  Count back from 20 0 points 0 points  Months in reverse 0 points 0 points  Repeat phrase 0 points 6 points  Total Score 0 6    Immunization History  Administered Date(s) Administered  . Influenza, High Dose Seasonal PF 08/01/2018  . Influenza, Seasonal, Injecte, Preservative Fre 08/18/2011  . Influenza,inj,Quad PF,6+ Mos 04/11/2014, 04/14/2015, 04/15/2016, 05/23/2017  . Influenza-Unspecified 04/11/2014  . Pneumococcal Conjugate-13 01/13/2018  . Tdap 04/11/2014  . Zoster 04/15/2016    Qualifies for Shingles Vaccine? Yes  Zostavax completed 2017. Due for Shingrix. Education has been provided regarding the importance of this vaccine. Pt has been advised to call insurance company to determine out of pocket expense. Advised may also receive vaccine at local pharmacy or Health Dept. Verbalized acceptance and understanding.  Tdap: Up to date  Flu Vaccine: Up to date  Pneumococcal Vaccine: Due for Pneumococcal vaccine. Does the patient want to receive this vaccine today?  Yes . Education has been provided regarding the  importance of this vaccine but still declined. Advised may receive this vaccine at local pharmacy or Health Dept. Aware to provide a copy of the vaccination record if obtained from local pharmacy or Health Dept. Verbalized acceptance and understanding.   Screening Tests Health Maintenance  Topic Date Due  . PNA vac Low Risk Adult (2 of 2 - PPSV23)  01/14/2019  . INFLUENZA VACCINE  03/17/2019  . Fecal DNA (Cologuard)  03/23/2021  . TETANUS/TDAP  04/11/2024  . Hepatitis C Screening  Completed   Cancer Screenings:  Colorectal Screening: Cologurad completed 03/23/18. Repeat every 3 years  Lung Cancer Screening: (Low Dose CT Chest recommended if Age 58-80 years, 30 pack-year currently smoking OR have quit w/in 15years.) does not qualify.   Additional Screening:  Hepatitis C Screening: does qualify; Completed 04/12/13  Vision Screening: Recommended annual ophthalmology exams for early detection of glaucoma and other disorders of the eye. Is the patient up to date with their annual eye exam?  Yes  Who is the provider or what is the name of the office in which the pt attends annual eye exams? Dr. Gloriann Loan  Dental Screening: Recommended annual dental exams for proper oral hygiene  Community Resource Referral:  CRR required this visit?  No       Plan:    I have personally reviewed and addressed the Medicare Annual Wellness questionnaire and have noted the following in the patient's chart:  A. Medical and social history B. Use of alcohol, tobacco or illicit drugs  C. Current medications and supplements D. Functional ability and status E.  Nutritional status F.  Physical activity G. Advance directives H. List of other physicians I.  Hospitalizations, surgeries, and ER visits in previous 12 months J.  [place] such as hearing and vision if needed, cognitive and depression L. Referrals and appointments   In addition, I have reviewed and discussed with patient certain preventive protocols, quality metrics, and best practice recommendations. A written personalized care plan for preventive services as well as general preventive health recommendations were provided to patient.   Signed,  Clemetine Marker, LPN  Nurse Health Advisor   Nurse Notes: pt c/o back pain since last Friday 5/29 when he bent over to pick something up.  Pain located on lower right side which he says has "gone out" before. He is currently alternating ibuprofen and heat for relief and feels like it is getting slightly better but advised patient to contact office if symptoms worsen or persist prior to next appt on 6/25.

## 2019-01-18 NOTE — Patient Instructions (Addendum)
Lee Harvey , Thank you for taking time to come for your Medicare Wellness Visit. I appreciate your ongoing commitment to your health goals. Please review the following plan we discussed and let me know if I can assist you in the future.   Screening recommendations/referrals: Colonoscopy: Cologuard done 03/23/18. Repeat in 2022. Recommended yearly ophthalmology/optometry visit for glaucoma screening and checkup Recommended yearly dental visit for hygiene and checkup  Vaccinations: Influenza vaccine: done 08/01/18 Pneumococcal vaccine: done today Tdap vaccine: done 04/11/14 Shingles vaccine: Shingrix discussed. Please contact your pharmacy for coverage information.   Advanced directives: Please bring a copy of your health care power of attorney and living will to the office at your convenience.  Conditions/risks identified:   Free hearing clinics offered in [place]:   [facility] [address], [place], [place] [address] [phone]  [facility], [place], [place] [address] [phone]  Next appointment: Please follow up in one year for your Medicare Annual Wellness visit.    Preventive Care 67 Years and Older, Male Preventive care refers to lifestyle choices and visits with your health care provider that can promote health and wellness. What does preventive care include?  A yearly physical exam. This is also called an annual well check.  Dental exams once or twice a year.  Routine eye exams. Ask your health care provider how often you should have your eyes checked.  Personal lifestyle choices, including:  Daily care of your teeth and gums.  Regular physical activity.  Eating a healthy diet.  Avoiding tobacco and drug use.  Limiting alcohol use.  Practicing safe sex.  Taking low doses of aspirin every day.  Taking vitamin and mineral supplements as recommended by your health care provider. What happens during an  annual well check? The services and screenings done by your health care provider during your annual well check will depend on your age, overall health, lifestyle risk factors, and family history of disease. Counseling  Your health care provider may ask you questions about your:  Alcohol use.  Tobacco use.  Drug use.  Emotional well-being.  Home and relationship well-being.  Sexual activity.  Eating habits.  History of falls.  Memory and ability to understand (cognition).  Work and work Statistician. Screening  You may have the following tests or measurements:  Height, weight, and BMI.  Blood pressure.  Lipid and cholesterol levels. These may be checked every 5 years, or more frequently if you are over 67 years old.  Skin check.  Lung cancer screening. You may have this screening every year starting at age 67 if you have a 30-pack-year history of smoking and currently smoke or have quit within the past 15 years.  Fecal occult blood test (FOBT) of the stool. You may have this test every year starting at age 67.  Flexible sigmoidoscopy or colonoscopy. You may have a sigmoidoscopy every 5 years or a colonoscopy every 10 years starting at age 67.  Prostate cancer screening. Recommendations will vary depending on your family history and other risks.  Hepatitis C blood test.  Hepatitis B blood test.  Sexually transmitted disease (STD) testing.  Diabetes screening. This is done by checking your blood sugar (glucose) after you have not eaten for a while (fasting). You may have this done every 1-3 years.  Abdominal aortic aneurysm (AAA) screening. You may need this if you are a current or former smoker.  Osteoporosis. You may be screened starting at  age 55 if you are at high risk. Talk with your health care provider about your test results, treatment options, and if necessary, the need for more tests. Vaccines  Your health care provider may recommend certain vaccines,  such as:  Influenza vaccine. This is recommended every year.  Tetanus, diphtheria, and acellular pertussis (Tdap, Td) vaccine. You may need a Td booster every 10 years.  Zoster vaccine. You may need this after age 67.  Pneumococcal 13-valent conjugate (PCV13) vaccine. One dose is recommended after age 67.  Pneumococcal polysaccharide (PPSV23) vaccine. One dose is recommended after age 67. Talk to your health care provider about which screenings and vaccines you need and how often you need them. This information is not intended to replace advice given to you by your health care provider. Make sure you discuss any questions you have with your health care provider. Document Released: 08/29/2015 Document Revised: 04/21/2016 Document Reviewed: 06/03/2015 Elsevier Interactive Patient Education  2017 [place] Prevention in the Home Falls can cause injuries. They can happen to people of all ages. There are many things you can do to make your home safe and to help prevent falls. What can I do on the outside of my home?  Regularly fix the edges of walkways and driveways and fix any cracks.  Remove anything that might make you trip as you walk through a door, such as a raised step or threshold.  Trim any bushes or trees on the path to your home.  Use bright outdoor lighting.  Clear any walking paths of anything that might make someone trip, such as rocks or tools.  Regularly check to see if handrails are loose or broken. Make sure that both sides of any steps have handrails.  Any raised decks and porches should have guardrails on the edges.  Have any leaves, snow, or ice cleared regularly.  Use sand or salt on walking paths during winter.  Clean up any spills in your garage right away. This includes oil or grease spills. What can I do in the bathroom?  Use night lights.  Install grab bars by the toilet and in the tub and shower. Do not use towel bars as grab bars.  Use  non-skid mats or decals in the tub or shower.  If you need to sit down in the shower, use a plastic, non-slip stool.  Keep the floor dry. Clean up any water that spills on the floor as soon as it happens.  Remove soap buildup in the tub or shower regularly.  Attach bath mats securely with double-sided non-slip rug tape.  Do not have throw rugs and other things on the floor that can make you trip. What can I do in the bedroom?  Use night lights.  Make sure that you have a light by your bed that is easy to reach.  Do not use any sheets or blankets that are too big for your bed. They should not hang down onto the floor.  Have a firm chair that has side arms. You can use this for support while you get dressed.  Do not have throw rugs and other things on the floor that can make you trip. What can I do in the kitchen?  Clean up any spills right away.  Avoid walking on wet floors.  Keep items that you use a lot in easy-to-reach places.  If you need to reach something above you, use a strong step stool that has a grab bar.  Keep electrical cords out of the way.  Do not use floor polish or wax that makes floors slippery. If you must use wax, use non-skid floor wax.  Do not have throw rugs and other things on the floor that can make you trip. What can I do with my stairs?  Do not leave any items on the stairs.  Make sure that there are handrails on both sides of the stairs and use them. Fix handrails that are broken or loose. Make sure that handrails are as long as the stairways.  Check any carpeting to make sure that it is firmly attached to the stairs. Fix any carpet that is loose or worn.  Avoid having throw rugs at the top or bottom of the stairs. If you do have throw rugs, attach them to the floor with carpet tape.  Make sure that you have a light switch at the top of the stairs and the bottom of the stairs. If you do not have them, ask someone to add them for you. What  else can I do to help prevent falls?  Wear shoes that:  Do not have high heels.  Have rubber bottoms.  Are comfortable and fit you well.  Are closed at the toe. Do not wear sandals.  If you use a stepladder:  Make sure that it is fully opened. Do not climb a closed stepladder.  Make sure that both sides of the stepladder are locked into place.  Ask someone to hold it for you, if possible.  Clearly mark and make sure that you can see:  Any grab bars or handrails.  First and last steps.  Where the edge of each step is.  Use tools that help you move around (mobility aids) if they are needed. These include:  Canes.  Walkers.  Scooters.  Crutches.  Turn on the lights when you go into a dark area. Replace any light bulbs as soon as they burn out.  Set up your furniture so you have a clear path. Avoid moving your furniture around.  If any of your floors are uneven, fix them.  If there are any pets around you, be aware of where they are.  Review your medicines with your doctor. Some medicines can make you feel dizzy. This can increase your chance of falling. Ask your doctor what other things that you can do to help prevent falls. This information is not intended to replace advice given to you by your health care provider. Make sure you discuss any questions you have with your health care provider. Document Released: 05/29/2009 Document Revised: 01/08/2016 Document Reviewed: 09/06/2014 Elsevier Interactive Patient Education  2017 [place].  Pneumococcal Polysaccharide Vaccine: What You Need to Know 1. Why get vaccinated? Vaccination can protect older adults (and some children and younger adults) from pneumococcal disease. Pneumococcal disease is caused by bacteria that can spread from person to person through close contact. It can cause ear infections, and it can also lead to more serious infections of the:  Lungs (pneumonia),  Blood (bacteremia), and   Covering of the brain and spinal cord (meningitis). Meningitis can cause deafness and brain damage, and it can be fatal. Anyone can get pneumococcal disease, but children under 32 years of age, people with certain medical conditions, adults over 53 years of age, and cigarette smokers are at the highest risk. About 18,000 older adults die each year from pneumococcal disease in the [place]. Treatment of pneumococcal infections with penicillin and other drugs  used to be more effective. But some strains of the disease have become resistant to these drugs. This makes prevention of the disease, through vaccination, even more important. 2. Pneumococcal polysaccharide vaccine (PPSV23) Pneumococcal polysaccharide vaccine (PPSV23) protects against 23 types of pneumococcal bacteria. It will not prevent all pneumococcal disease. PPSV23 is recommended for:  All adults 78 years of age and older,  Anyone 2 through 67 years of age with certain long-term health problems,  Anyone 2 through 67 years of age with a weakened immune system,  Adults 53 through 67 years of age who smoke cigarettes or have asthma. Most people need only one dose of PPSV. A second dose is recommended for certain high-risk groups. People 45 and older should get a dose even if they have gotten one or more doses of the vaccine before they turned 65. Your healthcare provider can give you more information about these recommendations. Most healthy adults develop protection within 2 to 3 weeks of getting the shot. 3. Some people should not get this vaccine  Anyone who has had a life-threatening allergic reaction to PPSV should not get another dose.  Anyone who has a severe allergy to any component of PPSV should not receive it. Tell your provider if you have any severe allergies.  Anyone who is moderately or severely ill when the shot is scheduled may be asked to wait until they recover before getting the vaccine. Someone with a mild  illness can usually be vaccinated.  Children less than 45 years of age should not receive this vaccine.  There is no evidence that PPSV is harmful to either a pregnant woman or to her fetus. However, as a precaution, women who need the vaccine should be vaccinated before becoming pregnant, if possible. 4. Risks of a vaccine reaction With any medicine, including vaccines, there is a chance of side effects. These are usually mild and go away on their own, but serious reactions are also possible. About half of people who get PPSV have mild side effects, such as redness or pain where the shot is given, which go away within about two days. Less than 1 out of 100 people develop a fever, muscle aches, or more severe local reactions. Problems that could happen after any vaccine:  People sometimes faint after a medical procedure, including vaccination. Sitting or lying down for about 15 minutes can help prevent fainting, and injuries caused by a fall. Tell your doctor if you feel dizzy, or have vision changes or ringing in the ears.  Some people get severe pain in the shoulder and have difficulty moving the arm where a shot was given. This happens very rarely.  Any medication can cause a severe allergic reaction. Such reactions from a vaccine are very rare, estimated at about 1 in a million doses, and would happen within a few minutes to a few hours after the vaccination. As with any medicine, there is a very remote chance of a vaccine causing a serious injury or death. The safety of vaccines is always being monitored. For more information, visit: http://www.aguilar.org/ 5. What if there is a serious reaction? What should I look for? Look for anything that concerns you, such as signs of a severe allergic reaction, very high fever, or unusual behavior. Signs of a severe allergic reaction can include hives, swelling of the face and throat, difficulty breathing, a fast heartbeat, dizziness, and weakness.  These would usually start a few minutes to a few hours after the vaccination. What should  I do? If you think it is a severe allergic reaction or other emergency that can't wait, call 9-1-1 or get to the nearest hospital. Otherwise, call your doctor. Afterward, the reaction should be reported to the Vaccine Adverse Event Reporting System (VAERS). Your doctor might file this report, or you can do it yourself through the VAERS web site at www.vaers.SamedayNews.es, or by calling (207)327-7987. VAERS does not give medical advice. 6. How can I learn more?  Ask your doctor. He or she can give you the vaccine package insert or suggest other sources of information.  Call your local or state health department.  Contact the Centers for Disease Control and Prevention (CDC): ? Call (570)219-8610 (1-800-CDC-INFO) or ? Visit CDC's website at http://hunter.com/ CDC Vaccine Information Statement PPSV Vaccine (12/07/2013) This information is not intended to replace advice given to you by your health care provider. Make sure you discuss any questions you have with your health care provider. Document Released: 05/30/2006 Document Revised: 03/14/2018 Document Reviewed: 03/14/2018 Elsevier Interactive Patient Education  2019 [employer].

## 2019-02-08 ENCOUNTER — Encounter: Payer: Self-pay | Admitting: Family Medicine

## 2019-02-08 ENCOUNTER — Ambulatory Visit (INDEPENDENT_AMBULATORY_CARE_PROVIDER_SITE_OTHER): Payer: PPO | Admitting: Family Medicine

## 2019-02-08 ENCOUNTER — Other Ambulatory Visit: Payer: Self-pay

## 2019-02-08 VITALS — BP 140/70 | HR 71 | Temp 97.5°F | Resp 16 | Ht 70.0 in | Wt 213.7 lb

## 2019-02-08 DIAGNOSIS — I77819 Aortic ectasia, unspecified site: Secondary | ICD-10-CM | POA: Diagnosis not present

## 2019-02-08 DIAGNOSIS — E7800 Pure hypercholesterolemia, unspecified: Secondary | ICD-10-CM

## 2019-02-08 DIAGNOSIS — R251 Tremor, unspecified: Secondary | ICD-10-CM

## 2019-02-08 DIAGNOSIS — Z79899 Other long term (current) drug therapy: Secondary | ICD-10-CM

## 2019-02-08 DIAGNOSIS — R739 Hyperglycemia, unspecified: Secondary | ICD-10-CM | POA: Diagnosis not present

## 2019-02-08 DIAGNOSIS — H9193 Unspecified hearing loss, bilateral: Secondary | ICD-10-CM

## 2019-02-08 DIAGNOSIS — I700 Atherosclerosis of aorta: Secondary | ICD-10-CM | POA: Diagnosis not present

## 2019-02-08 MED ORDER — ATORVASTATIN CALCIUM 40 MG PO TABS: 40.0000 mg | ORAL_TABLET | Freq: Every day | ORAL | 1 refills | Status: DC

## 2019-02-08 NOTE — Progress Notes (Addendum)
Name: Lee Harvey   MRN: [id number]    DOB: [birth year]   Date:02/08/2019       Progress Note  Subjective  Chief Complaint  Chief Complaint  Patient presents with  . Hyperlipidemia  . Hyperglycemia  . Back Pain    Back went out on him 2-3 weeks ago and it is still a little sore.    HPI   OA right knee: he only takes Tylenol occasionally, doing better, no recent effusion, pain is mild and intermittent now. He feels a popping sensation occasionally. He states notices some pain after playing disc golf and other times with weather changes, but feels better now  Back pain: he has recurrent episodes of right lower back pain, he states this last episodes was a few weeks ago, it was triggered by leaning forward to pick up something from the floor, no bowel or bladder incontinence, no radiculitis. Pain has was more like a catch when getting up, but improves with some ibuprofen and is doing better now. Discussed stretching exercises   Pre-diabetes : last hgbA1C was 5.7%  no polyphagia, polydipsia or polyuria.We will recheck A1C today   Hyperlipidemia: taking Atorvastatin and denies side effects of medication, last labs showed LDL at goal at 69. Recheck labs today    Obesity: he has lost 15 lbs in the past 6 months, he is drinking less sodas, no longer daily. Drinking Prop[place] water instead. He has been active most of the time, still playing disc golf and sometimes walks in his yard. Cutting down on sweets and carbohydrates  Atherosclerosis of aorta : he has been taking statin therapy, we will recheck level . Dilated aorta on screen US done in Dec, discussed results with patient , repeat study in 2024  Tremors : both hands and stable, worse when nervous.Not constant. Unchanged   Patient Active Problem List   Diagnosis Date Noted  . Dilation of aorta (HCC) 08/06/2018  . Atherosclerosis of aorta ([place]) 08/06/2018  . Right knee pain 10/20/2016  . Hyperlipidemia 04/22/2016  . Hyperglycemia  04/15/2016  . Difficulty hearing 04/13/2015  . Inguinal hernia 04/13/2015  . Tremor of both hands 04/13/2015  . Excess weight 04/13/2015    Past Surgical History:  Procedure Laterality Date  . CATARACT EXTRACTION, BILATERAL    . COLONOSCOPY  2015  . INGUINAL HERNIA REPAIR Left 11/30/2016   Procedure: HERNIA REPAIR INGUINAL ADULT;  Surgeon: Christene Lye, MD;  Location: [facility] ORS;  Service: General;  Laterality: Left;    Family History  Problem Relation Age of Onset  . Dementia Mother   . Cancer Father 20       Colon  . Thyroid disease Sister   . Mesothelioma Brother   . COPD Brother     Social History   Socioeconomic History  . Marital status: Single    Spouse name: Not on file  . Number of children: 0  . Years of education: Not on file  . Highest education level: 12th grade  Occupational History  . Occupation: Retired  Scientific laboratory technician  . Financial resource strain: Not hard at all  . Food insecurity    Worry: Never true    Inability: Never true  . Transportation needs    Medical: No    Non-medical: No  Tobacco Use  . Smoking status: Former Smoker    Packs/day: 0.50    Years: 20.00    Pack years: 10.00    Types: Cigarettes    Start date: 08/16/1986  Quit date: 08/16/2006    Years since quitting: 12.4  . Smokeless tobacco: Never Used  . Tobacco comment: smoking cessation materials not required  Substance and Sexual Activity  . Alcohol use: No    Alcohol/week: 0.0 standard drinks  . Drug use: No  . Sexual activity: Not Currently    Partners: Female  Lifestyle  . Physical activity    Days per week: 7 days    Minutes per session: 40 min  . Stress: Not at all  Relationships  . Social connections    Talks on phone: More than three times a week    Gets together: More than three times a week    Attends religious service: More than 4 times per year    Active member of club or organization: Yes    Attends meetings of clubs or organizations: More than 4  times per year    Relationship status: Divorced  . Intimate partner violence    Fear of current or ex partner: No    Emotionally abused: No    Physically abused: No    Forced sexual activity: No  Other Topics Concern  . Not on file  Social History Narrative   He lives alone, brother died 02/17/18   He was married twice but never had children   He is very close to his sister, but she lives in [place]   He sees his nephew on a regular basis.    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  acetaminophen (TYLENOL) 500 MG tablet, Take 1 tablet (500 mg total) by mouth every 6 (six) hours as needed. (Patient taking differently: Take 500 mg by mouth 2 (two) times daily. ), Disp: 30 tablet, Rfl: 0 .  aspirin EC 81 MG tablet, Take 1 tablet (81 mg total) by mouth daily., Disp: 30 tablet, Rfl: 0 .  atorvastatin (LIPITOR) 40 MG tablet, Take 1 tablet (40 mg total) by mouth daily., Disp: 90 tablet, Rfl: 1 .  calcium carbonate (TUMS - DOSED IN MG ELEMENTAL CALCIUM) 500 MG chewable tablet, Chew 1 tablet by mouth as needed for indigestion or heartburn., Disp: , Rfl:  .  ibuprofen (ADVIL) 600 MG tablet, TK 1 T PO Q 6 H AFTER SURGERY FOR 5 DAYS, Disp: , Rfl:  .  MULTIPLE VITAMIN PO, Take 1 tablet by mouth daily., Disp: , Rfl:  .  traMADol (ULTRAM) 50 MG tablet, TK 1 T PO Q 6 H PRF PAIN, Disp: , Rfl:   No Known Allergies  I personally reviewed active problem list, medication list, allergies, family history, social history with the patient/caregiver today.   ROS  Constitutional: Negative for fever , positive for  weight change.  Respiratory: Negative for cough and shortness of breath.   Cardiovascular: Negative for chest pain or palpitations.  Gastrointestinal: Negative for abdominal pain, no bowel changes.  Musculoskeletal: Negative for gait problem or joint swelling.  Skin: Negative for rash.  Neurological: Negative for dizziness or headache.  No other  specific complaints in a complete review of systems (except as listed in HPI above).   Objective  Vitals:   02-18-19 0747  BP: 140/70  Pulse: 71  Resp: 16  Temp: (!) 97.5 F (36.4 C)  TempSrc: Oral  SpO2: 99%  Weight: 213 lb 11.2 oz (96.9 kg)  Height: 5\' 10"  (1.778 m)    Body mass index is 30.66 kg/m.  Physical Exam  Constitutional: Patient appears  well-developed and well-nourished. Obese  No distress.  HEENT: head atraumatic, normocephalic, pupils equal and reactive to light,  neck supple, throat within normal limits Cardiovascular: Normal rate, regular rhythm and normal heart sounds.  No murmur heard. No BLE edema. Pulmonary/Chest: Effort normal and breath sounds normal. No respiratory distress. Abdominal: Soft.  There is no tenderness. Muscular skeletal: tremors on both hands, coarse, he has mild atrophy on left hand( tenar area), discussed referral to neurologist but wants to hold off for now Normal rom of spine, pain during palpation of right lower back  Psychiatric: Patient has a normal mood and affect. behavior is normal. Judgment and thought content normal.  PHQ2/9: Depression screen [facility] 2/9 02/08/2019 01/18/2019 08/01/2018 01/30/2018 05/23/2017  Decreased Interest 0 0 0 0 0  Down, Depressed, Hopeless 0 0 0 1 0  PHQ - 2 Score 0 0 0 1 0  Altered sleeping 0 - 0 0 -  Tired, decreased energy 0 - 0 0 -  Change in appetite 0 - 0 0 -  Feeling bad or failure about yourself  0 - 0 0 -  Trouble concentrating 0 - 0 0 -  Moving slowly or fidgety/restless 0 - 0 0 -  Suicidal thoughts 0 - 0 0 -  PHQ-9 Score 0 - 0 1 -  Difficult doing work/chores - - - Not difficult at all -    phq 9 is negative   Fall Risk: Fall Risk  02/08/2019 01/18/2019 08/01/2018 01/30/2018 01/13/2018  Falls in the past year? 0 1 0 No No  Number falls in past yr: 0 1 - - -  Comment - fell in a hole on golf course - - -  Injury with Fall? 0 0 - - -  Risk for fall due to : - - - - Impaired vision  Risk for  fall due to: Comment - - - - wears eyeglasses  Follow up - Falls prevention discussed - - -     Functional Status Survey: Is the patient deaf or have difficulty hearing?: Yes Does the patient have difficulty seeing, even when wearing glasses/contacts?: No Does the patient have difficulty concentrating, remembering, or making decisions?: No Does the patient have difficulty walking or climbing stairs?: No Does the patient have difficulty dressing or bathing?: No Does the patient have difficulty doing errands alone such as visiting a doctor's office or shopping?: No    Assessment & Plan  1. Pure hypercholesterolemia  - Lipid panel - atorvastatin (LIPITOR) 40 MG tablet; Take 1 tablet (40 mg total) by mouth daily.  Dispense: 90 tablet; Refill: 1  2. Hyperglycemia  - Hemoglobin A1c  3. Tremor of both hands  stable  4. Hearing difficulty of both ears  Discussed seeing ENT, he wants to pay for his partial plate first   5. Atherosclerosis of aorta ([place])  On statin therapy   6. Dilation of aorta (HCC)   7. Long-term use of high-risk medication  - COMPLETE METABOLIC PANEL WITH GFR - CBC with Differential/Platelet

## 2019-02-08 NOTE — Patient Instructions (Signed)

## 2019-02-09 LAB — COMPLETE METABOLIC PANEL WITH GFR
AG Ratio: 1.9 (calc) (ref 1.0–2.5)
ALT: 19 U/L (ref 9–46)
AST: 19 U/L (ref 10–35)
Albumin: 4.3 g/dL (ref 3.6–5.1)
Alkaline phosphatase (APISO): 108 U/L (ref 35–144)
BUN: 17 mg/dL (ref 7–25)
CO2: 27 mmol/L (ref 20–32)
Calcium: 9.3 mg/dL (ref 8.6–10.3)
Chloride: 107 mmol/L (ref 98–110)
Creat: 0.94 mg/dL (ref 0.70–1.25)
GFR, Est African American: 98 mL/min/{1.73_m2} (ref >=60)
GFR, Est Non African American: 84 mL/min/{1.73_m2} (ref >=60)
Globulin: 2.3 g/dL (calc) (ref 1.9–3.7)
Glucose, Bld: 101 mg/dL — ABNORMAL HIGH (ref 65–99)
Potassium: 4 mmol/L (ref 3.5–5.3)
Sodium: 140 mmol/L (ref 135–146)
Total Bilirubin: 0.8 mg/dL (ref 0.2–1.2)
Total Protein: 6.6 g/dL (ref 6.1–8.1)

## 2019-02-09 LAB — CBC WITH DIFFERENTIAL/PLATELET
Absolute Monocytes: 546 cells/uL (ref 200–950)
Basophils Absolute: 72 cells/uL (ref 0–200)
Basophils Relative: 1.2 %
Eosinophils Absolute: 210 cells/uL (ref 15–500)
Eosinophils Relative: 3.5 %
HCT: 41.8 % (ref 38.5–50.0)
Hemoglobin: 14.1 g/dL (ref 13.2–17.1)
Lymphs Abs: 1752 cells/uL (ref 850–3900)
MCH: 30.9 pg (ref 27.0–33.0)
MCHC: 33.7 g/dL (ref 32.0–36.0)
MCV: 91.5 fL (ref 80.0–100.0)
MPV: 10.8 fL (ref 7.5–12.5)
Monocytes Relative: 9.1 %
Neutro Abs: 3420 cells/uL (ref 1500–7800)
Neutrophils Relative %: 57 %
Platelets: 200 10*3/uL (ref 140–400)
RBC: 4.57 10*6/uL (ref 4.20–5.80)
RDW: 12.5 % (ref 11.0–15.0)
Total Lymphocyte: 29.2 %
WBC: 6 10*3/uL (ref 3.8–10.8)

## 2019-02-09 LAB — HEMOGLOBIN A1C
Hgb A1c MFr Bld: 5.8 % of total Hgb — ABNORMAL HIGH (ref <5.7)
Mean Plasma Glucose: 120 (calc)
eAG (mmol/L): 6.6 (calc)

## 2019-02-09 LAB — LIPID PANEL
Cholesterol: 137 mg/dL (ref <200)
HDL: 48 mg/dL (ref >=40)
LDL Cholesterol (Calc): 69 mg/dL (calc)
Non-HDL Cholesterol (Calc): 89 mg/dL (calc) (ref <130)
Total CHOL/HDL Ratio: 2.9 (calc) (ref <5.0)
Triglycerides: 116 mg/dL (ref <150)

## 2019-05-21 ENCOUNTER — Other Ambulatory Visit: Payer: Self-pay

## 2019-05-21 ENCOUNTER — Ambulatory Visit (INDEPENDENT_AMBULATORY_CARE_PROVIDER_SITE_OTHER): Payer: PPO

## 2019-05-21 ENCOUNTER — Ambulatory Visit: Payer: PPO

## 2019-05-21 DIAGNOSIS — Z23 Encounter for immunization: Secondary | ICD-10-CM

## 2019-05-22 ENCOUNTER — Ambulatory Visit: Payer: PPO

## 2019-07-31 ENCOUNTER — Encounter: Payer: Self-pay | Admitting: Family Medicine

## 2019-07-31 ENCOUNTER — Other Ambulatory Visit: Payer: Self-pay

## 2019-07-31 ENCOUNTER — Ambulatory Visit (INDEPENDENT_AMBULATORY_CARE_PROVIDER_SITE_OTHER): Payer: PPO | Admitting: Family Medicine

## 2019-07-31 VITALS — BP 140/68 | HR 86 | Temp 96.8°F | Resp 16 | Ht 70.0 in | Wt 221.6 lb

## 2019-07-31 DIAGNOSIS — I77819 Aortic ectasia, unspecified site: Secondary | ICD-10-CM | POA: Diagnosis not present

## 2019-07-31 DIAGNOSIS — R739 Hyperglycemia, unspecified: Secondary | ICD-10-CM | POA: Diagnosis not present

## 2019-07-31 DIAGNOSIS — E7800 Pure hypercholesterolemia, unspecified: Secondary | ICD-10-CM | POA: Diagnosis not present

## 2019-07-31 DIAGNOSIS — Z0000 Encounter for general adult medical examination without abnormal findings: Secondary | ICD-10-CM | POA: Diagnosis not present

## 2019-07-31 DIAGNOSIS — R7303 Prediabetes: Secondary | ICD-10-CM | POA: Diagnosis not present

## 2019-07-31 DIAGNOSIS — I700 Atherosclerosis of aorta: Secondary | ICD-10-CM | POA: Diagnosis not present

## 2019-07-31 DIAGNOSIS — R251 Tremor, unspecified: Secondary | ICD-10-CM | POA: Diagnosis not present

## 2019-07-31 NOTE — Patient Instructions (Signed)

## 2019-07-31 NOTE — Progress Notes (Signed)
Name: Lee Harvey   MRN: [id number]    DOB: Dec 20, 1950   Date:07/31/2019       Progress Note  Subjective  Chief Complaint  Chief Complaint  Patient presents with  . Migraine  . Gastroesophageal Reflux  . Depression  . Hyperlipidemia    HPI  Patient presents for annual CPE and follow up  OA right knee: he only takes Tylenol occasionally, doing better, no recent effusion, pain is mild and intermittent now. He feels a popping sensation occasionally, no fall but sometimes feels unstable. Discussed home exercises, he has been active and walking  He states symptoms triggered by cold and rainy weather   Pre-diabetes : last hgbA1C was 5.8%  no polyphagia, polydipsia or polyuria. He is eating healthier,  try to cook more at home. He is trying to avoid starches for breakfast, avoiding fast food, eats dinner at [employer]. He is now drinking Propel instead of Gatorade   Hyperlipidemia: taking Atorvastatin and denies side effects of medication, last labs showed LDL at goal at 69. No myalgia   Obesity: he lost 7 lbs since he has been walking 2-4 miles a couple of times a week, also playing disc golf, he is now drinking Propel, cutting down on sweet tea, and feeling well - at most two glasses a week   Tremors : both hands and stable, worse when nervous, left side worse than right Not constant and stable. Discussed referral to neurologist but he would like to hold off. He states his aunt had tremors   Atherosclerosis of Aorta and ectasy: repeat US in 2024, he is on statin therapy     Diet:  Exercise: he is active   Depression: phq 9 is negative Depression screen [facility] 2/9 07/31/2019 02/08/2019 01/18/2019 08/01/2018 01/30/2018  Decreased Interest 0 0 0 0 0  Down, Depressed, Hopeless 0 0 0 0 1  PHQ - 2 Score 0 0 0 0 1  Altered sleeping 0 0 - 0 0  Tired, decreased energy 0 0 - 0 0  Change in appetite 0 0 - 0 0  Feeling bad or failure about yourself  0 0 - 0 0  Trouble concentrating 0  0 - 0 0  Moving slowly or fidgety/restless 0 0 - 0 0  Suicidal thoughts 0 0 - 0 0  PHQ-9 Score 0 0 - 0 1  Difficult doing work/chores - - - - Not difficult at all    Hypertension:  BP Readings from Last 3 Encounters:  07/31/19 140/68  02/08/19 140/70  01/18/19 (!) 142/78    Obesity: Wt Readings from Last 3 Encounters:  07/31/19 221 lb 9.6 oz (100.5 kg)  02/08/19 213 lb 11.2 oz (96.9 kg)  01/18/19 212 lb 14.4 oz (96.6 kg)   BMI Readings from Last 3 Encounters:  07/31/19 31.80 kg/m  02/08/19 30.66 kg/m  01/18/19 30.55 kg/m     Lipids:  Lab Results  Component Value Date   CHOL 137 02/08/2019   CHOL 137 01/30/2018   CHOL 128 05/23/2017   Lab Results  Component Value Date   HDL 48 02/08/2019   HDL 50 01/30/2018   HDL 58 05/23/2017   Lab Results  Component Value Date   LDLCALC 69 02/08/2019   LDLCALC 69 01/30/2018   LDLCALC 52 05/23/2017   Lab Results  Component Value Date   TRIG 116 02/08/2019   TRIG 97 01/30/2018   TRIG 97 05/23/2017   Lab Results  Component Value Date  CHOLHDL 2.9 02/08/2019   CHOLHDL 2.7 01/30/2018   CHOLHDL 2.2 05/23/2017   No results found for: LDLDIRECT Glucose:  Glucose, Bld  Date Value Ref Range Status  02/08/2019 101 (H) 65 - 99 mg/dL Final    Comment:    .            Fasting reference interval . For someone without known diabetes, a glucose value between 100 and 125 mg/dL is consistent with prediabetes and should be confirmed with a follow-up test. .   01/30/2018 90 65 - 99 mg/dL Final    Comment:    .            Fasting reference interval .   05/23/2017 110 (H) 65 - 99 mg/dL Final    Comment:    .            Fasting reference interval . For someone without known diabetes, a glucose value between 100 and 125 mg/dL is consistent with prediabetes and should be confirmed with a follow-up test. .       [facility] Visit from 07/31/2019 in [facility]  AUDIT-C Score  0      Single STD  testing and prevention (HIV/chl/gon/syphilis): N/A Hep C: 2014   Skin cancer: Discussed monitoring for atypical lesions Colorectal cancer: repeat cologuard in 2022 Prostate cancer: discussed USPTF Lab Results  Component Value Date   PSA 0.9 05/23/2017   PSA 0.9 04/15/2016    IPSS Questionnaire (AUA-7): Over the past month.   1)  How often have you had a sensation of not emptying your bladder completely after you finish urinating?  0 - Not at all  2)  How often have you had to urinate again less than two hours after you finished urinating? 0 - Not at all  3)  How often have you found you stopped and started again several times when you urinated?  0 - Not at all  4) How difficult have you found it to postpone urination?  0 - Not at all  5) How often have you had a weak urinary stream?  4 - More than half the time  6) How often have you had to push or strain to begin urination?  0 - Not at all  7) How many times did you most typically get up to urinate from the time you went to bed until the time you got up in the morning?  1 - 1 time  Total score:  0-7 mildly symptomatic   8-19 moderately symptomatic   20-35 severely symptomatic    Lung cancer:   Low Dose CT Chest recommended if Age 67-80 years, 30 pack-year currently smoking OR have quit w/in 15years. Patient does not qualify.   AAA: screen done 07/2018 needs repeat US in 2024  ECG:  2018   Advanced Care Planning: A voluntary discussion about advance care planning including the explanation and discussion of advance directives.  Discussed health care proxy and Living will, and the patient was able to identify a health care proxy as his nephew  .  Patient does not have a living will at present time. If patient does have living will, I have requested they bring this to the clinic to be scanned in to their chart.  Patient Active Problem List   Diagnosis Date Noted  . Dilation of aorta (HCC) 08/06/2018  . Atherosclerosis of aorta ([place])  08/06/2018  . Right knee pain 10/20/2016  . Hyperlipidemia 04/22/2016  .  Hyperglycemia 04/15/2016  . Difficulty hearing 04/13/2015  . Inguinal hernia 04/13/2015  . Tremor of both hands 04/13/2015  . Excess weight 04/13/2015    Past Surgical History:  Procedure Laterality Date  . CATARACT EXTRACTION, BILATERAL    . COLONOSCOPY  2015  . INGUINAL HERNIA REPAIR Left 11/30/2016   Procedure: HERNIA REPAIR INGUINAL ADULT;  Surgeon: Christene Lye, MD;  Location: [facility] ORS;  Service: General;  Laterality: Left;    Family History  Problem Relation Age of Onset  . Dementia Mother   . Cancer Father 35       Colon  . Thyroid disease Sister   . Mesothelioma Brother   . COPD Brother     Social History   Socioeconomic History  . Marital status: Single    Spouse name: Not on file  . Number of children: 0  . Years of education: Not on file  . Highest education level: 12th grade  Occupational History  . Occupation: Retired  Tobacco Use  . Smoking status: Former Smoker    Packs/day: 0.50    Years: 20.00    Pack years: 10.00    Types: Cigarettes    Start date: 08/16/1986    Quit date: 08/16/2006    Years since quitting: 12.9  . Smokeless tobacco: Never Used  . Tobacco comment: smoking cessation materials not required  Substance and Sexual Activity  . Alcohol use: No    Alcohol/week: 0.0 standard drinks  . Drug use: No  . Sexual activity: Not Currently    Partners: Female  Other Topics Concern  . Not on file  Social History Narrative   He lives alone, brother died 02/11/18   He was married twice but never had children   He is very close to his sister, but she lives in [place]   He sees his nephew on a regular basis.    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 Difficulty of Paying  Living Expenses: Not hard at all  Food Insecurity: No Food Insecurity  . Worried About Charity fundraiser in the Last Year: Never true  . Ran Out of Food in the Last Year: Never true  Transportation Needs: No Transportation Needs  . Lack of Transportation (Medical): No  . Lack of Transportation (Non-Medical): No  Physical Activity: Sufficiently Active  . Days of Exercise per Week: 4 days  . Minutes of Exercise per Session: 40 min  Stress: No Stress Concern Present  . Feeling of Stress : Not at all  Social Connections: Slightly Isolated  . Frequency of Communication with Friends and Family: More than three times a week  . Frequency of Social Gatherings with Friends and Family: More than three times a week  . Attends Religious Services: More than 4 times per year  . Active Member of Clubs or Organizations: Yes  . Attends Archivist Meetings: More than 4 times per year  . Marital Status: Widowed  Intimate Partner Violence: Not At Risk  . Fear of Current or Ex-Partner: No  . Emotionally Abused: No  . Physically Abused: No  . Sexually Abused: No     Current Outpatient Medications:  .  acetaminophen (TYLENOL) 500 MG tablet, Take 1 tablet (500 mg total) by  mouth every 6 (six) hours as needed. (Patient taking differently: Take 500 mg by mouth 2 (two) times daily. ), Disp: 30 tablet, Rfl: 0 .  aspirin EC 81 MG tablet, Take 1 tablet (81 mg total) by mouth daily., Disp: 30 tablet, Rfl: 0 .  atorvastatin (LIPITOR) 40 MG tablet, Take 1 tablet (40 mg total) by mouth daily., Disp: 90 tablet, Rfl: 1 .  calcium carbonate (TUMS - DOSED IN MG ELEMENTAL CALCIUM) 500 MG chewable tablet, Chew 1 tablet by mouth as needed for indigestion or heartburn., Disp: , Rfl:  .  ibuprofen (ADVIL) 600 MG tablet, TK 1 T PO Q 6 H AFTER SURGERY FOR 5 DAYS, Disp: , Rfl:  .  MULTIPLE VITAMIN PO, Take 1 tablet by mouth daily., Disp: , Rfl:   No Known Allergies   ROS  Constitutional: Negative for fever or  weight change.  Respiratory: Negative for cough and shortness of breath.   Cardiovascular: Negative for chest pain or palpitations.  Gastrointestinal: Negative for abdominal pain, no bowel changes.  Musculoskeletal: Negative for gait problem or joint swelling.  Skin: Negative for rash.  Neurological: Negative for dizziness or headache.  No other specific complaints in a complete review of systems (except as listed in HPI above).   Objective  Vitals:   07/31/19 1325  BP: 140/68  Pulse: 86  Resp: 16  Temp: (!) 96.8 F (36 C)  TempSrc: Temporal  SpO2: 95%  Weight: 221 lb 9.6 oz (100.5 kg)  Height: 5\' 10"  (1.778 m)    Body mass index is 31.8 kg/m.  Physical Exam  Constitutional: Patient appears well-developed and well-nourished. No distress.  Nose and oral mucosa not done  Eyes: Conjunctivae and EOM are normal. Pupils are equal, round, and reactive to light. No scleral icterus.  Neck: Normal range of motion. Neck supple. No JVD present. No thyromegaly present.  Cardiovascular: Normal rate, regular rhythm and normal heart sounds.  No murmur heard. No BLE edema. Pulmonary/Chest: Effort normal and breath sounds normal. No respiratory distress. Abdominal: Soft. Bowel sounds are normal, no distension. There is no tenderness. no masses MALE GENITALIA: Normal descended testes bilaterally, no masses palpated, no hernias, no lesions, no discharge RECTAL: not done  Musculoskeletal: Normal range of motion, no joint effusions. No gross deformities Neurological: he is alert and oriented to person, place, and time. No cranial nerve deficit. Coordination, balance, strength, speech and gait are normal. He has coarse and worse at rest, left side worse than right, romberg negative  Skin: Skin is warm and dry. No rash noted. No erythema.  Psychiatric: Patient has a normal mood and affect. behavior is normal. Judgment and thought content normal.  PHQ2/9: Depression screen [facility] 2/9 07/31/2019  02/08/2019 01/18/2019 08/01/2018 01/30/2018  Decreased Interest 0 0 0 0 0  Down, Depressed, Hopeless 0 0 0 0 1  PHQ - 2 Score 0 0 0 0 1  Altered sleeping 0 0 - 0 0  Tired, decreased energy 0 0 - 0 0  Change in appetite 0 0 - 0 0  Feeling bad or failure about yourself  0 0 - 0 0  Trouble concentrating 0 0 - 0 0  Moving slowly or fidgety/restless 0 0 - 0 0  Suicidal thoughts 0 0 - 0 0  PHQ-9 Score 0 0 - 0 1  Difficult doing work/chores - - - - Not difficult at all     Fall Risk: Fall Risk  07/31/2019 02/08/2019 01/18/2019 08/01/2018 01/30/2018  Falls in the  past year? 0 0 1 0 No  Number falls in past yr: 0 0 1 - -  Comment - - fell in a hole on golf course - -  Injury with Fall? 0 0 0 - -  Risk for fall due to : - - - - -  Risk for fall due to: Comment - - - - -  Follow up - - Falls prevention discussed - -    Functional Status Survey: Is the patient deaf or have difficulty hearing?: No Does the patient have difficulty seeing, even when wearing glasses/contacts?: No Does the patient have difficulty concentrating, remembering, or making decisions?: No Does the patient have difficulty walking or climbing stairs?: No Does the patient have difficulty dressing or bathing?: No Does the patient have difficulty doing errands alone such as visiting a doctor's office or shopping?: No    Assessment & Plan  1. Atherosclerosis of aorta ([place])  Repeat in 2024   2. Tremor of both hands  stable  3. Dilation of aorta ([place])  Recheck in 2024   4. Pre-diabetes  Last A1C was done 01/2019, recheck it yearly   5. Pure hypercholesterolemia  On statin therapy   6. Hyperglycemia   7. Well adult health check    -Prostate cancer screening and PSA options (with potential risks and benefits of testing vs not testing) were discussed along with recent recs/guidelines. -USPSTF grade A and B recommendations reviewed with patient; age-appropriate recommendations, preventive care, screening  tests, etc discussed and encouraged; healthy living encouraged; see AVS for patient education given to patient -Discussed importance of 150 minutes of physical activity weekly, eat two servings of fish weekly, eat one serving of tree nuts ( cashews, pistachios, pecans, almonds.Marland Kitchen) every other day, eat 6 servings of fruit/vegetables daily and drink plenty of water and avoid sweet beverages.

## 2019-08-13 ENCOUNTER — Encounter: Payer: PPO | Admitting: Family Medicine

## 2019-09-16 ENCOUNTER — Other Ambulatory Visit: Payer: Self-pay | Admitting: Family Medicine

## 2019-09-16 DIAGNOSIS — E7800 Pure hypercholesterolemia, unspecified: Secondary | ICD-10-CM

## 2020-01-22 ENCOUNTER — Ambulatory Visit: Payer: PPO

## 2020-01-29 ENCOUNTER — Ambulatory Visit: Payer: PPO | Admitting: Family Medicine

## 2020-03-13 ENCOUNTER — Other Ambulatory Visit: Payer: Self-pay | Admitting: Family Medicine

## 2020-03-13 DIAGNOSIS — E7800 Pure hypercholesterolemia, unspecified: Secondary | ICD-10-CM

## 2020-04-01 ENCOUNTER — Ambulatory Visit: Payer: PPO

## 2020-04-08 NOTE — Progress Notes (Signed)
Name: Lee Harvey   MRN: [id number]    DOB: 02/07/1951   Date:04/09/2020       Progress Note  Subjective  Chief Complaint  Chief Complaint  Patient presents with  . Hyperglycemia  . Hyperlipidemia  . Immunizations    Shingrix at pharmacy    HPI  OA right knee: he only takes Tylenol occasionally,no recent effusion, pain is mild and intermittent, but he feels like occasionally his right knee feels like it will give out, discussed if increase of symptoms to let me know and I will refer him to Ortho He feels a popping sensation occasionally Discussed home exercises, he has been active and walking  He states symptoms triggered by cold and rainy weather  Pre-diabetes: last hgbA1C was5.8%no polyphagia, polydipsia or polyuria. He is eating healthier,  try to cook more at home, but was staying with sister in [place] and was not as active over the past few months   Hyperlipidemia: taking Atorvastatin and denies side effects of medication,last labs showed LDL at goal at 69.No myalgia . We will recheck labs   Obesity: weight is stable today, he states since he is back from [place] he is going to resume playing disc golf and walk, [place] was too hot  Tremors : both hands and stable, worse when nervous, left side worse than right Not constant and stable. Discussed referral to neurologist but he would like to hold off. He states his aunt had tremors Unchanged, no balance problems or memory changes    Atherosclerosis of Aorta and ectasy: repeat US in 2024, he is on statin therapy , recheck labs   Patient Active Problem List   Diagnosis Date Noted  . Dilation of aorta (HCC) 08/06/2018  . Atherosclerosis of aorta ([place]) 08/06/2018  . Right knee pain 10/20/2016  . Hyperlipidemia 04/22/2016  . Hyperglycemia 04/15/2016  . Difficulty hearing 04/13/2015  . Inguinal hernia 04/13/2015  . Tremor of both hands 04/13/2015  . Excess weight 04/13/2015    Past Surgical History:  Procedure Laterality  Date  . CATARACT EXTRACTION, BILATERAL    . COLONOSCOPY  2015  . INGUINAL HERNIA REPAIR Left 11/30/2016   Procedure: HERNIA REPAIR INGUINAL ADULT;  Surgeon: Christene Lye, MD;  Location: [facility] ORS;  Service: General;  Laterality: Left;    Family History  Problem Relation Age of Onset  . Dementia Mother   . Cancer Father 52       Colon  . Thyroid disease Sister   . Mesothelioma Brother   . COPD Brother     Social History   Tobacco Use  . Smoking status: Former Smoker    Packs/day: 0.50    Years: 20.00    Pack years: 10.00    Types: Cigarettes    Start date: 08/16/1986    Quit date: 08/16/2006    Years since quitting: 13.6  . Smokeless tobacco: Never Used  . Tobacco comment: smoking cessation materials not required  Substance Use Topics  . Alcohol use: No    Alcohol/week: 0.0 standard drinks     Current Outpatient Medications:  .  acetaminophen (TYLENOL) 500 MG tablet, Take 1 tablet (500 mg total) by mouth every 6 (six) hours as needed. (Patient taking differently: Take 500 mg by mouth 2 (two) times daily. ), Disp: 30 tablet, Rfl: 0 .  aspirin EC 81 MG tablet, Take 1 tablet (81 mg total) by mouth daily., Disp: 30 tablet, Rfl: 0 .  atorvastatin (LIPITOR) 40 MG tablet, TAKE 1  TABLET(40 MG) BY MOUTH DAILY, Disp: 30 tablet, Rfl: 0 .  calcium carbonate (TUMS - DOSED IN MG ELEMENTAL CALCIUM) 500 MG chewable tablet, Chew 1 tablet by mouth as needed for indigestion or heartburn., Disp: , Rfl:  .  ibuprofen (ADVIL) 600 MG tablet, TK 1 T PO Q 6 H AFTER SURGERY FOR 5 DAYS, Disp: , Rfl:  .  MULTIPLE VITAMIN PO, Take 1 tablet by mouth daily., Disp: , Rfl:   No Known Allergies  I personally reviewed active problem list, medication list, allergies, family history, social history, health maintenance with the patient/caregiver today.   ROS  Constitutional: Negative for fever , positive for mild weight change.  Respiratory: Negative for cough and shortness of breath.    Cardiovascular: Negative for chest pain or palpitations.  Gastrointestinal: Negative for abdominal pain, no bowel changes.  Musculoskeletal: positive for gait problem- occasionally has right knee instabilty or joint swelling.  Skin: Negative for rash.  Neurological: Negative for dizziness or headache.  No other specific complaints in a complete review of systems (except as listed in HPI above).  Objective  Vitals:   04/09/20 1045  BP: 132/70  Pulse: 78  Resp: 18  Temp: 97.9 F (36.6 C)  TempSrc: Oral  SpO2: 97%  Weight: 223 lb 9.6 oz (101.4 kg)  Height: 5\' 10"  (1.778 m)    Body mass index is 32.08 kg/m.  Physical Exam  Constitutional: Patient appears well-developed and well-nourished. Obese No distress.  HEENT: head atraumatic, normocephalic, pupils equal and reactive to light,  neck supple, throat within normal limits Cardiovascular: Normal rate, regular rhythm and normal heart sounds.  No murmur heard. No BLE edema. Pulmonary/Chest: Effort normal and breath sounds normal. No respiratory distress. Abdominal: Soft.  There is no tenderness. Muscular Skeletal: no  pain during palpation of right lower back,he has discomfort with right lateral bending and extension caused pain,  negative straight leg raise, , no effusion, scar on right knee from injury while he was a child Psychiatric: Patient has a normal mood and affect. behavior is normal. Judgment and thought content normal. Neurological: tremors of hands when extending it forward   PHQ2/9: Depression screen [facility] 2/9 04/09/2020 07/31/2019 02/08/2019 01/18/2019 08/01/2018  Decreased Interest 0 0 0 0 0  Down, Depressed, Hopeless 0 0 0 0 0  PHQ - 2 Score 0 0 0 0 0  Altered sleeping 0 0 0 - 0  Tired, decreased energy 0 0 0 - 0  Change in appetite 0 0 0 - 0  Feeling bad or failure about yourself  0 0 0 - 0  Trouble concentrating 0 0 0 - 0  Moving slowly or fidgety/restless 0 0 0 - 0  Suicidal thoughts 0 0 0 - 0  PHQ-9 Score 0  0 0 - 0  Difficult doing work/chores - - - - -    phq 9 is negative   Fall Risk: Fall Risk  04/09/2020 07/31/2019 02/08/2019 01/18/2019 08/01/2018  Falls in the past year? 0 0 0 1 0  Number falls in past yr: 0 0 0 1 -  Comment - - - fell in a hole on golf course -  Injury with Fall? 0 0 0 0 -  Risk for fall due to : - - - - -  Risk for fall due to: Comment - - - - -  Follow up - - - Falls prevention discussed -     Functional Status Survey: Is the patient deaf or have difficulty  hearing?: Yes Does the patient have difficulty seeing, even when wearing glasses/contacts?: No Does the patient have difficulty concentrating, remembering, or making decisions?: No Does the patient have difficulty walking or climbing stairs?: No Does the patient have difficulty dressing or bathing?: No Does the patient have difficulty doing errands alone such as visiting a doctor's office or shopping?: No    Assessment & Plan  1. Atherosclerosis of aorta (HCC)  - atorvastatin (LIPITOR) 40 MG tablet; Take 1 tablet (40 mg total) by mouth daily.  Dispense: 90 tablet; Refill: 1  2. Pre-diabetes   3. Pure hypercholesterolemia  - Lipid panel - atorvastatin (LIPITOR) 40 MG tablet; Take 1 tablet (40 mg total) by mouth daily.  Dispense: 90 tablet; Refill: 1  4. Dilation of aorta (HCC)  Recheck doppler in 07/2023   5. Hyperglycemia  - Hemoglobin A1c  6. Tremor of both hands  Stable   7. Chronic pain of right knee  Taking Tylenol prn   8. Primary osteoarthritis of right knee   9. Long-term use of high-risk medication  - CBC with Differential/Platelet - COMPLETE METABOLIC PANEL WITH GFR  10. Chronic right-sided low back pain without sciatica  Discussed home exercises for now

## 2020-04-09 ENCOUNTER — Other Ambulatory Visit: Payer: Self-pay

## 2020-04-09 ENCOUNTER — Ambulatory Visit (INDEPENDENT_AMBULATORY_CARE_PROVIDER_SITE_OTHER): Payer: PPO | Admitting: Family Medicine

## 2020-04-09 ENCOUNTER — Encounter: Payer: Self-pay | Admitting: Family Medicine

## 2020-04-09 VITALS — BP 132/70 | HR 78 | Temp 97.9°F | Resp 18 | Ht 70.0 in | Wt 223.6 lb

## 2020-04-09 DIAGNOSIS — R7303 Prediabetes: Secondary | ICD-10-CM | POA: Diagnosis not present

## 2020-04-09 DIAGNOSIS — R739 Hyperglycemia, unspecified: Secondary | ICD-10-CM

## 2020-04-09 DIAGNOSIS — Z79899 Other long term (current) drug therapy: Secondary | ICD-10-CM

## 2020-04-09 DIAGNOSIS — M25561 Pain in right knee: Secondary | ICD-10-CM | POA: Diagnosis not present

## 2020-04-09 DIAGNOSIS — M1711 Unilateral primary osteoarthritis, right knee: Secondary | ICD-10-CM | POA: Diagnosis not present

## 2020-04-09 DIAGNOSIS — G8929 Other chronic pain: Secondary | ICD-10-CM | POA: Diagnosis not present

## 2020-04-09 DIAGNOSIS — M545 Low back pain: Secondary | ICD-10-CM

## 2020-04-09 DIAGNOSIS — E7800 Pure hypercholesterolemia, unspecified: Secondary | ICD-10-CM | POA: Diagnosis not present

## 2020-04-09 DIAGNOSIS — R251 Tremor, unspecified: Secondary | ICD-10-CM

## 2020-04-09 DIAGNOSIS — I700 Atherosclerosis of aorta: Secondary | ICD-10-CM

## 2020-04-09 DIAGNOSIS — I77819 Aortic ectasia, unspecified site: Secondary | ICD-10-CM | POA: Diagnosis not present

## 2020-04-09 MED ORDER — ATORVASTATIN CALCIUM 40 MG PO TABS: 40.0000 mg | ORAL_TABLET | Freq: Every day | ORAL | 1 refills | Status: DC

## 2020-04-10 LAB — COMPLETE METABOLIC PANEL WITH GFR
AG Ratio: 2 (calc) (ref 1.0–2.5)
ALT: 31 U/L (ref 9–46)
AST: 22 U/L (ref 10–35)
Albumin: 4.4 g/dL (ref 3.6–5.1)
Alkaline phosphatase (APISO): 117 U/L (ref 35–144)
BUN: 13 mg/dL (ref 7–25)
CO2: 27 mmol/L (ref 20–32)
Calcium: 9.1 mg/dL (ref 8.6–10.3)
Chloride: 104 mmol/L (ref 98–110)
Creat: 1.16 mg/dL (ref 0.70–1.25)
GFR, Est African American: 75 mL/min/{1.73_m2} (ref >=60)
GFR, Est Non African American: 65 mL/min/{1.73_m2} (ref >=60)
Globulin: 2.2 g/dL (calc) (ref 1.9–3.7)
Glucose, Bld: 108 mg/dL — ABNORMAL HIGH (ref 65–99)
Potassium: 4.6 mmol/L (ref 3.5–5.3)
Sodium: 140 mmol/L (ref 135–146)
Total Bilirubin: 1 mg/dL (ref 0.2–1.2)
Total Protein: 6.6 g/dL (ref 6.1–8.1)

## 2020-04-10 LAB — CBC WITH DIFFERENTIAL/PLATELET
Absolute Monocytes: 739 cells/uL (ref 200–950)
Basophils Absolute: 109 cells/uL (ref 0–200)
Basophils Relative: 1.3 %
Eosinophils Absolute: 386 cells/uL (ref 15–500)
Eosinophils Relative: 4.6 %
HCT: 41.7 % (ref 38.5–50.0)
Hemoglobin: 14.1 g/dL (ref 13.2–17.1)
Lymphs Abs: 2411 cells/uL (ref 850–3900)
MCH: 31.3 pg (ref 27.0–33.0)
MCHC: 33.8 g/dL (ref 32.0–36.0)
MCV: 92.7 fL (ref 80.0–100.0)
MPV: 10.4 fL (ref 7.5–12.5)
Monocytes Relative: 8.8 %
Neutro Abs: 4754 cells/uL (ref 1500–7800)
Neutrophils Relative %: 56.6 %
Platelets: 206 10*3/uL (ref 140–400)
RBC: 4.5 10*6/uL (ref 4.20–5.80)
RDW: 12.5 % (ref 11.0–15.0)
Total Lymphocyte: 28.7 %
WBC: 8.4 10*3/uL (ref 3.8–10.8)

## 2020-04-10 LAB — LIPID PANEL
Cholesterol: 137 mg/dL (ref <200)
HDL: 50 mg/dL (ref >=40)
LDL Cholesterol (Calc): 69 mg/dL (calc)
Non-HDL Cholesterol (Calc): 87 mg/dL (calc) (ref <130)
Total CHOL/HDL Ratio: 2.7 (calc) (ref <5.0)
Triglycerides: 97 mg/dL (ref <150)

## 2020-04-10 LAB — HEMOGLOBIN A1C
Hgb A1c MFr Bld: 5.9 % of total Hgb — ABNORMAL HIGH (ref <5.7)
Mean Plasma Glucose: 123 (calc)
eAG (mmol/L): 6.8 (calc)

## 2020-04-22 ENCOUNTER — Other Ambulatory Visit: Payer: Self-pay

## 2020-04-22 ENCOUNTER — Ambulatory Visit (INDEPENDENT_AMBULATORY_CARE_PROVIDER_SITE_OTHER): Payer: PPO

## 2020-04-22 VITALS — BP 134/84 | HR 68 | Temp 97.9°F | Resp 16 | Ht 70.0 in | Wt 225.2 lb

## 2020-04-22 DIAGNOSIS — Z23 Encounter for immunization: Secondary | ICD-10-CM | POA: Diagnosis not present

## 2020-04-22 DIAGNOSIS — Z0000 Encounter for general adult medical examination without abnormal findings: Secondary | ICD-10-CM

## 2020-04-22 NOTE — Patient Instructions (Signed)
Lee Harvey , Thank you for taking time to come for your Medicare Wellness Visit. I appreciate your ongoing commitment to your health goals. Please review the following plan we discussed and let me know if I can assist you in the future.   Screening recommendations/referrals: Colonoscopy: Cologuard done 03/23/18. Repeat in 2022. Recommended yearly ophthalmology/optometry visit for glaucoma screening and checkup Recommended yearly dental visit for hygiene and checkup  Vaccinations: Influenza vaccine: done today  Pneumococcal vaccine: done 01/18/19 Tdap vaccine: done 04/11/14 Shingles vaccine: Shingrix discussed. Please contact your pharmacy for coverage information.  Covid-19:  Please bring your vaccination record with you to your next appointment.   Advanced directives: Advance directive discussed with you today. I have provided a copy for you to complete at home and have notarized. Once this is complete please bring a copy in to our office so we can scan it into your chart.  Conditions/risks identified: Keep up the great work!  Next appointment: Follow up in one year for your annual wellness visit.   Preventive Care 36 Years and Older, Male Preventive care refers to lifestyle choices and visits with your health care provider that can promote health and wellness. What does preventive care include?  A yearly physical exam. This is also called an annual well check.  Dental exams once or twice a year.  Routine eye exams. Ask your health care provider how often you should have your eyes checked.  Personal lifestyle choices, including:  Daily care of your teeth and gums.  Regular physical activity.  Eating a healthy diet.  Avoiding tobacco and drug use.  Limiting alcohol use.  Practicing safe sex.  Taking low doses of aspirin every day.  Taking vitamin and mineral supplements as recommended by your health care provider. What happens during an annual well check? The services and  screenings done by your health care provider during your annual well check will depend on your age, overall health, lifestyle risk factors, and family history of disease. Counseling  Your health care provider may ask you questions about your:  Alcohol use.  Tobacco use.  Drug use.  Emotional well-being.  Home and relationship well-being.  Sexual activity.  Eating habits.  History of falls.  Memory and ability to understand (cognition).  Work and work Statistician. Screening  You may have the following tests or measurements:  Height, weight, and BMI.  Blood pressure.  Lipid and cholesterol levels. These may be checked every 5 years, or more frequently if you are over 27 years old.  Skin check.  Lung cancer screening. You may have this screening every year starting at age 89 if you have a 30-pack-year history of smoking and currently smoke or have quit within the past 15 years.  Fecal occult blood test (FOBT) of the stool. You may have this test every year starting at age 41.  Flexible sigmoidoscopy or colonoscopy. You may have a sigmoidoscopy every 5 years or a colonoscopy every 10 years starting at age 49.  Prostate cancer screening. Recommendations will vary depending on your family history and other risks.  Hepatitis C blood test.  Hepatitis B blood test.  Sexually transmitted disease (STD) testing.  Diabetes screening. This is done by checking your blood sugar (glucose) after you have not eaten for a while (fasting). You may have this done every 1-3 years.  Abdominal aortic aneurysm (AAA) screening. You may need this if you are a current or former smoker.  Osteoporosis. You may be screened starting at age  70 if you are at high risk. Talk with your health care provider about your test results, treatment options, and if necessary, the need for more tests. Vaccines  Your health care provider may recommend certain vaccines, such as:  Influenza vaccine. This is  recommended every year.  Tetanus, diphtheria, and acellular pertussis (Tdap, Td) vaccine. You may need a Td booster every 10 years.  Zoster vaccine. You may need this after age 16.  Pneumococcal 13-valent conjugate (PCV13) vaccine. One dose is recommended after age 2.  Pneumococcal polysaccharide (PPSV23) vaccine. One dose is recommended after age 24. Talk to your health care provider about which screenings and vaccines you need and how often you need them. This information is not intended to replace advice given to you by your health care provider. Make sure you discuss any questions you have with your health care provider. Document Released: 08/29/2015 Document Revised: 04/21/2016 Document Reviewed: 06/03/2015 Elsevier Interactive Patient Education  2017 [place] Prevention in the Home Falls can cause injuries. They can happen to people of all ages. There are many things you can do to make your home safe and to help prevent falls. What can I do on the outside of my home?  Regularly fix the edges of walkways and driveways and fix any cracks.  Remove anything that might make you trip as you walk through a door, such as a raised step or threshold.  Trim any bushes or trees on the path to your home.  Use bright outdoor lighting.  Clear any walking paths of anything that might make someone trip, such as rocks or tools.  Regularly check to see if handrails are loose or broken. Make sure that both sides of any steps have handrails.  Any raised decks and porches should have guardrails on the edges.  Have any leaves, snow, or ice cleared regularly.  Use sand or salt on walking paths during winter.  Clean up any spills in your garage right away. This includes oil or grease spills. What can I do in the bathroom?  Use night lights.  Install grab bars by the toilet and in the tub and shower. Do not use towel bars as grab bars.  Use non-skid mats or decals in the tub or  shower.  If you need to sit down in the shower, use a plastic, non-slip stool.  Keep the floor dry. Clean up any water that spills on the floor as soon as it happens.  Remove soap buildup in the tub or shower regularly.  Attach bath mats securely with double-sided non-slip rug tape.  Do not have throw rugs and other things on the floor that can make you trip. What can I do in the bedroom?  Use night lights.  Make sure that you have a light by your bed that is easy to reach.  Do not use any sheets or blankets that are too big for your bed. They should not hang down onto the floor.  Have a firm chair that has side arms. You can use this for support while you get dressed.  Do not have throw rugs and other things on the floor that can make you trip. What can I do in the kitchen?  Clean up any spills right away.  Avoid walking on wet floors.  Keep items that you use a lot in easy-to-reach places.  If you need to reach something above you, use a strong step stool that has a grab bar.  Keep  electrical cords out of the way.  Do not use floor polish or wax that makes floors slippery. If you must use wax, use non-skid floor wax.  Do not have throw rugs and other things on the floor that can make you trip. What can I do with my stairs?  Do not leave any items on the stairs.  Make sure that there are handrails on both sides of the stairs and use them. Fix handrails that are broken or loose. Make sure that handrails are as long as the stairways.  Check any carpeting to make sure that it is firmly attached to the stairs. Fix any carpet that is loose or worn.  Avoid having throw rugs at the top or bottom of the stairs. If you do have throw rugs, attach them to the floor with carpet tape.  Make sure that you have a light switch at the top of the stairs and the bottom of the stairs. If you do not have them, ask someone to add them for you. What else can I do to help prevent  falls?  Wear shoes that:  Do not have high heels.  Have rubber bottoms.  Are comfortable and fit you well.  Are closed at the toe. Do not wear sandals.  If you use a stepladder:  Make sure that it is fully opened. Do not climb a closed stepladder.  Make sure that both sides of the stepladder are locked into place.  Ask someone to hold it for you, if possible.  Clearly mark and make sure that you can see:  Any grab bars or handrails.  First and last steps.  Where the edge of each step is.  Use tools that help you move around (mobility aids) if they are needed. These include:  Canes.  Walkers.  Scooters.  Crutches.  Turn on the lights when you go into a dark area. Replace any light bulbs as soon as they burn out.  Set up your furniture so you have a clear path. Avoid moving your furniture around.  If any of your floors are uneven, fix them.  If there are any pets around you, be aware of where they are.  Review your medicines with your doctor. Some medicines can make you feel dizzy. This can increase your chance of falling. Ask your doctor what other things that you can do to help prevent falls. This information is not intended to replace advice given to you by your health care provider. Make sure you discuss any questions you have with your health care provider. Document Released: 05/29/2009 Document Revised: 01/08/2016 Document Reviewed: 09/06/2014 Elsevier Interactive Patient Education  2017 [employer].

## 2020-04-22 NOTE — Progress Notes (Signed)
Subjective:   Lee Harvey is a 69 y.o. male who presents for Medicare Annual/Subsequent preventive examination.  Review of Systems     Cardiac Risk Factors include: advanced age (>33men, >60 women);dyslipidemia;male gender     Objective:    Today's Vitals   04/22/20 1547  BP: 134/84  Pulse: 68  Resp: 16  Temp: 97.9 F (36.6 C)  TempSrc: Oral  SpO2: 97%  Weight: 225 lb 3.2 oz (102.2 kg)  Height: 5\' 10"  (1.778 m)   Body mass index is 32.31 kg/m.  Advanced Directives 04/22/2020 01/18/2019 01/13/2018 05/23/2017 11/30/2016 11/22/2016 10/20/2016  Does Patient Have a Medical Advance Directive? No Yes No;Yes No Yes Yes Yes  Type of Advance Directive - Healthcare Power of Attorney Living will - Living will Living will Living will  Does patient want to make changes to medical advance directive? - - Yes (MAU/Ambulatory/Procedural Areas - Information given) - No - Patient declined - -  Copy of [place] in Chart? - No - copy requested - - - - -  Would patient like information on creating a medical advance directive? Yes (MAU/Ambulatory/Procedural Areas - Information given) - - - - - -    Current Medications (verified) Outpatient Encounter Medications as of 04/22/2020  Medication Sig  . acetaminophen (TYLENOL) 500 MG tablet Take 1 tablet (500 mg total) by mouth every 6 (six) hours as needed. (Patient taking differently: Take 500 mg by mouth 2 (two) times daily. )  . aspirin EC 81 MG tablet Take 1 tablet (81 mg total) by mouth daily.  Marland Kitchen atorvastatin (LIPITOR) 40 MG tablet Take 1 tablet (40 mg total) by mouth daily.  Marland Kitchen ibuprofen (ADVIL) 600 MG tablet TK 1 T PO Q 6 H AFTER SURGERY FOR 5 DAYS  . MULTIPLE VITAMIN PO Take 1 tablet by mouth daily.  . [DISCONTINUED] calcium carbonate (TUMS - DOSED IN MG ELEMENTAL CALCIUM) 500 MG chewable tablet Chew 1 tablet by mouth as needed for indigestion or heartburn.   No facility-administered encounter medications on file as of 04/22/2020.     Allergies (verified) Patient has no known allergies.   History: Past Medical History:  Diagnosis Date  . Arthritis    KNEE RIGHT  . GERD (gastroesophageal reflux disease)    OCC  . History of hemorrhoids   . HL (hearing loss)   . Hyperglycemia   . Hyperlipidemia   . Left inguinal hernia   . Tremors of nervous system   . Trigger finger    Past Surgical History:  Procedure Laterality Date  . CATARACT EXTRACTION, BILATERAL    . COLONOSCOPY  2015  . INGUINAL HERNIA REPAIR Left 11/30/2016   Procedure: HERNIA REPAIR INGUINAL ADULT;  Surgeon: Christene Lye, MD;  Location: [facility] ORS;  Service: General;  Laterality: Left;   Family History  Problem Relation Age of Onset  . Dementia Mother   . Cancer Father 73       Colon  . Thyroid disease Sister   . Mesothelioma Brother   . COPD Brother    Social History   Socioeconomic History  . Marital status: Single    Spouse name: Not on file  . Number of children: 0  . Years of education: Not on file  . Highest education level: 12th grade  Occupational History  . Occupation: Retired  Tobacco Use  . Smoking status: Former Smoker    Packs/day: 0.50    Years: 20.00    Pack years: 10.00  Types: Cigarettes    Start date: 08/16/1986    Quit date: 08/16/2006    Years since quitting: 13.6  . Smokeless tobacco: Never Used  . Tobacco comment: smoking cessation materials not required  Vaping Use  . Vaping Use: Never used  Substance and Sexual Activity  . Alcohol use: No    Alcohol/week: 0.0 standard drinks  . Drug use: No  . Sexual activity: Not Currently    Partners: Female  Other Topics Concern  . Not on file  Social History Narrative   He lives alone, brother died 2018-02-25   He was married twice but never had children   He is very close to his sister, but she lives in [place]   He sees his nephew on a regular basis.    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 Difficulty of Paying Living Expenses: Not hard at all  Food Insecurity: No Food Insecurity  . Worried About Charity fundraiser in the Last Year: Never true  . Ran Out of Food in the Last Year: Never true  Transportation Needs: No Transportation Needs  . Lack of Transportation (Medical): No  . Lack of Transportation (Non-Medical): No  Physical Activity: Sufficiently Active  . Days of Exercise per Week: 5 days  . Minutes of Exercise per Session: 40 min  Stress: No Stress Concern Present  . Feeling of Stress : Not at all  Social Connections: Moderately Integrated  . Frequency of Communication with Friends and Family: More than three times a week  . Frequency of Social Gatherings with Friends and Family: More than three times a week  . Attends Religious Services: More than 4 times per year  . Active Member of Clubs or Organizations: Yes  . Attends Archivist Meetings: More than 4 times per year  . Marital Status: Widowed    Tobacco Counseling Counseling given: Not Answered Comment: smoking cessation materials not required   Clinical Intake:  Pre-visit preparation completed: Yes  Pain : No/denies pain     BMI - recorded: 32.31 Nutritional Status: BMI > 30  Obese Nutritional Risks: None Diabetes: No  How often do you need to have someone help you when you read instructions, pamphlets, or other written materials from your doctor or pharmacy?: 1 - Never    Interpreter Needed?: No  Information entered by :: Clemetine Marker LPN   Activities of Daily Living In your present state of health, do you have any difficulty performing the following activities: 04/22/2020 04/09/2020  Hearing? Y Y  Comment declines hearing aids -  Vision? N N  Difficulty concentrating or making decisions? N N  Walking or climbing stairs? N N   Dressing or bathing? N N  Doing errands, shopping? N N  Preparing Food and eating ? N -  Using the Toilet? N -  In the past six months, have you accidently leaked urine? N -  Do you have problems with loss of bowel control? N -  Managing your Medications? N -  Managing your Finances? N -  Housekeeping or managing your Housekeeping? N -  Some recent data might be hidden    Patient Care Team: Steele Sizer, MD as PCP - General (Family Medicine) Lorelee Cover., MD  as Consulting Physician (Ophthalmology)  Indicate any recent Medical Services you may have received from other than [facility] providers in the past year (date may be approximate).     Assessment:   This is a routine wellness examination for [place].  Hearing/Vision screen  Hearing Screening   125Hz  250Hz  500Hz  1000Hz  2000Hz  3000Hz  4000Hz  6000Hz  8000Hz   Right ear:           Left ear:           Comments: Pt c/o hearing difficulty but no hearing aids  Vision Screening Comments: Vision screenings done by Dr. Gloriann Loan  Dietary issues and exercise activities discussed: Current Exercise Habits: Home exercise routine, Type of exercise: walking;Other - see comments (disc golf), Time (Minutes): 40, Frequency (Times/Week): 5, Weekly Exercise (Minutes/Week): 200, Intensity: Moderate, Exercise limited by: None identified  Goals    . DIET - INCREASE WATER INTAKE     Recommend to drink at least 6-8 8oz glasses of water per day.      Depression Screen PHQ 2/9 Scores 04/22/2020 04/09/2020 07/31/2019 02/08/2019 01/18/2019 08/01/2018 01/30/2018  PHQ - 2 Score 0 0 0 0 0 0 1  PHQ- 9 Score - 0 0 0 - 0 1    Fall Risk Fall Risk  04/22/2020 04/09/2020 07/31/2019 02/08/2019 01/18/2019  Falls in the past year? 0 0 0 0 1  Number falls in past yr: 0 0 0 0 1  Comment - - - - fell in a hole on golf course  Injury with Fall? 0 0 0 0 0  Risk for fall due to : No Fall Risks - - - -  Risk for fall due to: Comment - - - - -  Follow up Falls prevention discussed  - - - Falls prevention discussed    Any stairs in or around the home? Yes  If so, are there any without handrails? No    Home free of loose throw rugs in walkways, pet beds, electrical cords, etc? Yes  Adequate lighting in your home to reduce risk of falls? Yes   ASSISTIVE DEVICES UTILIZED TO PREVENT FALLS:  Life alert? No  Use of a cane, walker or w/c? No  Grab bars in the bathroom? No  Shower chair or bench in shower? No  Elevated toilet seat or a handicapped toilet? Yes   TIMED UP AND GO:  Was the test performed? Yes .  Length of time to ambulate 10 feet: 5 sec.   Gait steady and fast without use of assistive device  Cognitive Function: 6CIT deferred for 2021 AWV. Pt states no memory issues.      6CIT Screen 01/18/2019 01/13/2018  What Year? 0 points 0 points  What month? 0 points 0 points  What time? 0 points 0 points  Count back from 20 0 points 0 points  Months in reverse 0 points 0 points  Repeat phrase 0 points 6 points  Total Score 0 6    Immunizations Immunization History  Administered Date(s) Administered  . Fluad Quad(high Dose 65+) 05/21/2019, 04/22/2020  . Influenza, High Dose Seasonal PF 08/01/2018  . Influenza, Seasonal, Injecte, Preservative Fre 08/18/2011  . Influenza,inj,Quad PF,6+ Mos 04/11/2014, 04/14/2015, 04/15/2016, 05/23/2017  . Influenza-Unspecified 04/11/2014  . Pneumococcal Conjugate-13 01/13/2018  . Pneumococcal Polysaccharide-23 01/18/2019  . Tdap 04/11/2014  . Zoster 04/15/2016    TDAP status: Up to date   Flu Vaccine status: Completed at today's visit   Pneumococcal vaccine status: Up to date   Covid-19 vaccine status:  Completed vaccines. Patient advised to bring vaccination record to next appt.   Qualifies for Shingles Vaccine? Yes   Zostavax completed Yes   Shingrix Completed?: No.    Education has been provided regarding the importance of this vaccine. Patient has been advised to call insurance company to determine out of  pocket expense if they have not yet received this vaccine. Advised may also receive vaccine at local pharmacy or Health Dept. Verbalized acceptance and understanding.  Screening Tests Health Maintenance  Topic Date Due  . COVID-19 Vaccine (1) 04/25/2020 (Originally 06/14/1963)  . Fecal DNA (Cologuard)  03/23/2021  . TETANUS/TDAP  04/11/2024  . INFLUENZA VACCINE  Completed  . Hepatitis C Screening  Completed  . PNA vac Low Risk Adult  Completed    Health Maintenance  There are no preventive care reminders to display for this patient.  Colorectal cancer screening: Cologuard completed 03/23/18. Repeat every 3 years.    Lung Cancer Screening: (Low Dose CT Chest recommended if Age 52-80 years, 30 pack-year currently smoking OR have quit w/in 15years.) does not qualify.   Additional Screening:  Hepatitis C Screening: does qualify; Completed 04/12/13  Vision Screening: Recommended annual ophthalmology exams for early detection of glaucoma and other disorders of the eye. Is the patient up to date with their annual eye exam?  Yes  Who is the provider or what is the name of the office in which the patient attends annual eye exams? Dr. Gloriann Loan  Dental Screening: Recommended annual dental exams for proper oral hygiene  Community Resource Referral / Chronic Care Management: CRR required this visit?  No   CCM required this visit?  No      Plan:     I have personally reviewed and noted the following in the patient's chart:   . Medical and social history . Use of alcohol, tobacco or illicit drugs  . Current medications and supplements . Functional ability and status . Nutritional status . Physical activity . Advanced directives . List of other physicians . Hospitalizations, surgeries, and ER visits in previous 12 months . Vitals . Screenings to include cognitive, depression, and falls . Referrals and appointments  In addition, I have reviewed and discussed with patient certain  preventive protocols, quality metrics, and best practice recommendations. A written personalized care plan for preventive services as well as general preventive health recommendations were provided to patient.     Clemetine Marker, LPN   02/21/4695   Nurse Notes: patient doing well and appreciative of visit today.

## 2020-05-12 ENCOUNTER — Ambulatory Visit: Payer: PPO

## 2020-08-19 DIAGNOSIS — L255 Unspecified contact dermatitis due to plants, except food: Secondary | ICD-10-CM | POA: Diagnosis not present

## 2020-08-26 NOTE — Progress Notes (Signed)
Name: Lee Harvey   MRN: [id number]    DOB: 25-Dec-1950   Date:08/27/2020       Progress Note  Subjective  Chief Complaint  Annual Exam  HPI  Patient presents for annual CPE   IPSS Questionnaire (AUA-7): Over the past month.   1)  How often have you had a sensation of not emptying your bladder completely after you finish urinating?  2  2)  How often have you had to urinate again less than two hours after you finished urinating? 1  3)  How often have you found you stopped and started again several times when you urinated?  2  4) How difficult have you found it to postpone urination?  0  5) How often have you had a weak urinary stream?  2  6) How often have you had to push or strain to begin urination?  1  7) How many times did you most typically get up to urinate from the time you went to bed until the time you got up in the morning?  1  Total score:  0-7 mildly symptomatic   8-19 moderately symptomatic   20-35 severely symptomatic     Diet:  Exercise: discussed 150 minutes per week , no longer playing golf with friends , he likes being active with friends He will try to walk for 50 minutes three days a week   Depression: phq 9 is positive , does not like getting out of the house when it is cold - he states he is working on his house and frustrated about the lack of supplies, he states also frustrated with COVID-19   Depression screen [facility] 2/9 08/27/2020 04/22/2020 04/09/2020 07/31/2019 02/08/2019  Decreased Interest 1 0 0 0 0  Down, Depressed, Hopeless 0 0 0 0 0  PHQ - 2 Score 1 0 0 0 0  Altered sleeping 1 - 0 0 0  Tired, decreased energy 0 - 0 0 0  Change in appetite 0 - 0 0 0  Feeling bad or failure about yourself  0 - 0 0 0  Trouble concentrating 0 - 0 0 0  Moving slowly or fidgety/restless 0 - 0 0 0  Suicidal thoughts 0 - 0 0 0  PHQ-9 Score 2 - 0 0 0  Difficult doing work/chores Not difficult at all - - - -    Hypertension:  BP Readings from Last 3 Encounters:  08/27/20  138/86  04/22/20 134/84  04/09/20 132/70    Obesity: Wt Readings from Last 3 Encounters:  08/27/20 237 lb 4.8 oz (107.6 kg)  04/22/20 225 lb 3.2 oz (102.2 kg)  04/09/20 223 lb 9.6 oz (101.4 kg)   BMI Readings from Last 3 Encounters:  08/27/20 34.05 kg/m  04/22/20 32.31 kg/m  04/09/20 32.08 kg/m     Lipids:  Lab Results  Component Value Date   CHOL 137 04/09/2020   CHOL 137 02/08/2019   CHOL 137 01/30/2018   Lab Results  Component Value Date   HDL 50 04/09/2020   HDL 48 02/08/2019   HDL 50 01/30/2018   Lab Results  Component Value Date   LDLCALC 69 04/09/2020   LDLCALC 69 02/08/2019   LDLCALC 69 01/30/2018   Lab Results  Component Value Date   TRIG 97 04/09/2020   TRIG 116 02/08/2019   TRIG 97 01/30/2018   Lab Results  Component Value Date   CHOLHDL 2.7 04/09/2020   CHOLHDL 2.9 02/08/2019   CHOLHDL 2.7 01/30/2018  No results found for: LDLDIRECT Glucose:  Glucose, Bld  Date Value Ref Range Status  04/09/2020 108 (H) 65 - 99 mg/dL Final    Comment:    .            Fasting reference interval . For someone without known diabetes, a glucose value between 100 and 125 mg/dL is consistent with prediabetes and should be confirmed with a follow-up test. .   02/08/2019 101 (H) 65 - 99 mg/dL Final    Comment:    .            Fasting reference interval . For someone without known diabetes, a glucose value between 100 and 125 mg/dL is consistent with prediabetes and should be confirmed with a follow-up test. .   01/30/2018 90 65 - 99 mg/dL Final    Comment:    .            Fasting reference interval .     [place] [facility] Visit from 08/27/2020 in [facility]  AUDIT-C Score 0      Single STD testing and prevention (HIV/chl/gon/syphilis): not interested  Hep C: 2014   Skin cancer: Discussed monitoring for atypical lesions Colorectal cancer: repeat cologuard 03/2021  Prostate cancer:  Lab Results  Component Value  Date   PSA 0.9 05/23/2017   PSA 0.9 04/15/2016     Lung cancer:   Low Dose CT Chest recommended if Age 23-80 years, 30 pack-year currently smoking OR have quit w/in 15years. Patient does not qualify.   AAA: done in 2019 , need to repeat in 2024  ECG:  10/20/16  Vaccines:   Shingrix: 49-64 yo and ask insurance if covered when patient above 31 yo  Pneumonia:  educated and discussed with patient. Flu:  educated and discussed with patient.  Advanced Care Planning: A voluntary discussion about advance care planning including the explanation and discussion of advance directives.  Discussed health care proxy and Living will, and the patient was able to identify a health care proxy as sister - she lives in [place].  Patient does not have a living will at present time. If patient does have living will, I have requested they bring this to the clinic to be scanned in to their chart.  Patient Active Problem List   Diagnosis Date Noted  . Dilation of aorta (HCC) 08/06/2018  . Atherosclerosis of aorta ([place]) 08/06/2018  . Right knee pain 10/20/2016  . Hyperlipidemia 04/22/2016  . Hyperglycemia 04/15/2016  . Difficulty hearing 04/13/2015  . Inguinal hernia 04/13/2015  . Tremor of both hands 04/13/2015  . Excess weight 04/13/2015    Past Surgical History:  Procedure Laterality Date  . CATARACT EXTRACTION, BILATERAL    . COLONOSCOPY  2015  . INGUINAL HERNIA REPAIR Left 11/30/2016   Procedure: HERNIA REPAIR INGUINAL ADULT;  Surgeon: Christene Lye, MD;  Location: [facility] ORS;  Service: General;  Laterality: Left;    Family History  Problem Relation Age of Onset  . Dementia Mother   . Cancer Father 49       Colon  . Thyroid disease Sister   . Mesothelioma Brother   . COPD Brother     Social History   Socioeconomic History  . Marital status: Single    Spouse name: Not on file  . Number of children: 0  . Years of education: Not on file  . Highest education level: 12th grade   Occupational History  . Occupation: Retired  Tobacco  Use  . Smoking status: Former Smoker    Packs/day: 0.50    Years: 20.00    Pack years: 10.00    Types: Cigarettes    Start date: 08/16/1986    Quit date: 08/16/2006    Years since quitting: 14.0  . Smokeless tobacco: Never Used  . Tobacco comment: smoking cessation materials not required  Vaping Use  . Vaping Use: Never used  Substance and Sexual Activity  . Alcohol use: No    Alcohol/week: 0.0 standard drinks  . Drug use: No  . Sexual activity: Not Currently    Partners: Female  Other Topics Concern  . Not on file  Social History Narrative   He lives alone, brother died February 20, 2018   He was married twice but never had children   He is very close to his sister, but she lives in [place]   He sees his nephew on a regular basis.    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 Difficulty of Paying Living Expenses: Not hard at all  Food Insecurity: No Food Insecurity  . Worried About Charity fundraiser in the Last Year: Never true  . Ran Out of Food in the Last Year: Never true  Transportation Needs: No Transportation Needs  . Lack of Transportation (Medical): No  . Lack of Transportation (Non-Medical): No  Physical Activity: Insufficiently Active  . Days of Exercise per Week: 3 days  . Minutes of Exercise per Session: 30 min  Stress: No Stress Concern Present  . Feeling of Stress : Only a little  Social Connections: Moderately Isolated  . Frequency of Communication with Friends and Family: Twice a week  . Frequency of Social Gatherings with Friends and Family: Three times a week  . Attends Religious Services: 1 to 4 times per year  . Active Member of Clubs or Organizations: No  . Attends Archivist Meetings: Never  . Marital  Status: Divorced  Human resources officer Violence: Not At Risk  . Fear of Current or Ex-Partner: No  . Emotionally Abused: No  . Physically Abused: No  . Sexually Abused: No     Current Outpatient Medications:  .  acetaminophen (TYLENOL) 500 MG tablet, Take 1 tablet (500 mg total) by mouth every 6 (six) hours as needed. (Patient taking differently: Take 500 mg by mouth 2 (two) times daily.), Disp: 30 tablet, Rfl: 0 .  aspirin EC 81 MG tablet, Take 1 tablet (81 mg total) by mouth daily., Disp: 30 tablet, Rfl: 0 .  atorvastatin (LIPITOR) 40 MG tablet, Take 1 tablet (40 mg total) by mouth daily., Disp: 90 tablet, Rfl: 1 .  ibuprofen (ADVIL) 600 MG tablet, TK 1 T PO Q 6 H AFTER SURGERY FOR 5 DAYS, Disp: , Rfl:  .  MULTIPLE VITAMIN PO, Take 1 tablet by mouth daily., Disp: , Rfl:   No Known Allergies   ROS  Constitutional: Negative for fever , positive for  weight change.  Respiratory: Negative for cough and shortness of breath.   Cardiovascular: Negative for chest pain or palpitations.  Gastrointestinal: Negative for abdominal pain, no bowel changes.  Musculoskeletal: Negative for gait problem or joint swelling.  Skin: Negative for rash.  Neurological: Negative for dizziness or headache.  No other  specific complaints in a complete review of systems (except as listed in HPI above).  Objective  Vitals:   08/27/20 0840  BP: 138/86  Pulse: 62  Resp: 18  Temp: 98.1 F (36.7 C)  SpO2: 97%  Weight: 237 lb 4.8 oz (107.6 kg)  Height: 5\' 10"  (1.778 m)    Body mass index is 34.05 kg/m.  Physical Exam  Constitutional: Patient appears well-developed and well-nourished. No distress.  HENT: Head: Normocephalic and atraumatic. Ears: B TMs ok, no erythema or effusion; Nose:not done  Mouth/Throat: not done   Eyes: Conjunctivae and EOM are normal. Pupils are equal, round, and reactive to light. No scleral icterus.  Neck: Normal range of motion. Neck supple. No JVD present. No thyromegaly  present.  Cardiovascular: Normal rate, regular rhythm and normal heart sounds.  No murmur heard. No BLE edema. Pulmonary/Chest: Effort normal and breath sounds normal. No respiratory distress. Abdominal: Soft. Bowel sounds are normal, no distension. There is no tenderness. no masses MALE GENITALIA: Normal descended testes bilaterally, no masses palpated, no hernias, no lesions, no discharge RECTAL: Prostate only partially felt, no masses, slightly enlarged  no rectal masses or hemorrhoids  Musculoskeletal: Normal range of motion, no joint effusions. No gross deformities Neurological: he is alert and oriented to person, place, and time. No cranial nerve deficit. Coordination, balance, strength, speech and gait are normal. he has hand tremors at rest, both hands, left worse than right  Skin: Skin is warm and dry. No rash noted. No erythema. Senile purpura on left hand - likely familial - aunt had it  Psychiatric: Patient has a normal mood and affect. behavior is normal. Judgment and thought content normal.  Fall Risk: Fall Risk  08/27/2020 04/22/2020 04/09/2020 07/31/2019 02/08/2019  Falls in the past year? 0 0 0 0 0  Number falls in past yr: 0 0 0 0 0  Comment - - - - -  Injury with Fall? 0 0 0 0 0  Risk for fall due to : - No Fall Risks - - -  Risk for fall due to: Comment - - - - -  Follow up - Falls prevention discussed - - -     Functional Status Survey: Is the patient deaf or have difficulty hearing?: Yes Does the patient have difficulty seeing, even when wearing glasses/contacts?: No Does the patient have difficulty concentrating, remembering, or making decisions?: No Does the patient have difficulty walking or climbing stairs?: No Does the patient have difficulty dressing or bathing?: No Does the patient have difficulty doing errands alone such as visiting a doctor's office or shopping?: No    Assessment & Plan  1. Encounter for Medicare annual wellness exam  - PSA  2. Lower  urinary tract symptoms (LUTS)  - PSA   3. Senile purpura ([place])  Reassurance given  -Prostate cancer screening and PSA options (with potential risks and benefits of testing vs not testing) were discussed along with recent recs/guidelines. -USPSTF grade A and B recommendations reviewed with patient; age-appropriate recommendations, preventive care, screening tests, etc discussed and encouraged; healthy living encouraged; see AVS for patient education given to patient -Discussed importance of 150 minutes of physical activity weekly, eat two servings of fish weekly, eat one serving of tree nuts ( cashews, pistachios, pecans, almonds.Marland Kitchen) every other day, eat 6 servings of fruit/vegetables daily and drink plenty of water and avoid sweet beverages.

## 2020-08-27 ENCOUNTER — Ambulatory Visit (INDEPENDENT_AMBULATORY_CARE_PROVIDER_SITE_OTHER): Payer: PPO | Admitting: Family Medicine

## 2020-08-27 ENCOUNTER — Encounter: Payer: Self-pay | Admitting: Family Medicine

## 2020-08-27 ENCOUNTER — Other Ambulatory Visit: Payer: Self-pay

## 2020-08-27 VITALS — BP 138/86 | HR 62 | Temp 98.1°F | Resp 18 | Ht 70.0 in | Wt 237.3 lb

## 2020-08-27 DIAGNOSIS — D692 Other nonthrombocytopenic purpura: Secondary | ICD-10-CM

## 2020-08-27 DIAGNOSIS — Z0000 Encounter for general adult medical examination without abnormal findings: Secondary | ICD-10-CM | POA: Diagnosis not present

## 2020-08-27 DIAGNOSIS — R399 Unspecified symptoms and signs involving the genitourinary system: Secondary | ICD-10-CM

## 2020-08-27 LAB — PSA: PSA: 0.94 ng/mL (ref <=4.0)

## 2020-08-27 NOTE — Patient Instructions (Signed)
Preventive Care 65 Years and Older, Male Preventive care refers to lifestyle choices and visits with your health care provider that can promote health and wellness. This includes:  A yearly physical exam. This is also called an annual wellness visit.  Regular dental and eye exams.  Immunizations.  Screening for certain conditions.  Healthy lifestyle choices, such as: ? Eating a healthy diet. ? Getting regular exercise. ? Not using drugs or products that contain nicotine and tobacco. ? Limiting alcohol use. What can I expect for my preventive care visit? Physical exam Your health care provider will check your:  Height and weight. These may be used to calculate your BMI (body mass index). BMI is a measurement that tells if you are at a healthy weight.  Heart rate and blood pressure.  Body temperature.  Skin for abnormal spots. Counseling Your health care provider may ask you questions about your:  Past medical problems.  Family's medical history.  Alcohol, tobacco, and drug use.  Emotional well-being.  Home life and relationship well-being.  Sexual activity.  Diet, exercise, and sleep habits.  History of falls.  Memory and ability to understand (cognition).  Work and work environment.  Access to firearms. What immunizations do I need? Vaccines are usually given at various ages, according to a schedule. Your health care provider will recommend vaccines for you based on your age, medical history, and lifestyle or other factors, such as travel or where you work.   What tests do I need? Blood tests  Lipid and cholesterol levels. These may be checked every 5 years, or more often depending on your overall health.  Hepatitis C test.  Hepatitis B test. Screening  Lung cancer screening. You may have this screening every year starting at age 55 if you have a 30-pack-year history of smoking and currently smoke or have quit within the past 15 years.  Colorectal  cancer screening. ? All adults should have this screening starting at age 50 and continuing until age 75. ? Your health care provider may recommend screening at age 45 if you are at increased risk. ? You will have tests every 1-10 years, depending on your results and the type of screening test.  Prostate cancer screening. Recommendations will vary depending on your family history and other risks.  Genital exam to check for testicular cancer or hernias.  Diabetes screening. ? This is done by checking your blood sugar (glucose) after you have not eaten for a while (fasting). ? You may have this done every 1-3 years.  Abdominal aortic aneurysm (AAA) screening. You may need this if you are a current or former smoker.  STD (sexually transmitted disease) testing, if you are at risk. Follow these instructions at home: Eating and drinking  Eat a diet that includes fresh fruits and vegetables, whole grains, lean protein, and low-fat dairy products. Limit your intake of foods with high amounts of sugar, saturated fats, and salt.  Take vitamin and mineral supplements as recommended by your health care provider.  Do not drink alcohol if your health care provider tells you not to drink.  If you drink alcohol: ? Limit how much you have to 0-2 drinks a day. ? Be aware of how much alcohol is in your drink. In the [place] one drink equals one 12 oz bottle of beer (355 mL), one 5 oz glass of wine (148 mL), or one 1 oz glass of hard liquor (44 mL).   Lifestyle  Take daily care of your teeth   and gums. Brush your teeth every morning and night with fluoride toothpaste. Floss one time each day.  Stay active. Exercise for at least 30 minutes 5 or more days each week.  Do not use any products that contain nicotine or tobacco, such as cigarettes, e-cigarettes, and chewing tobacco. If you need help quitting, ask your health care provider.  Do not use drugs.  If you are sexually active, practice safe sex.  Use a condom or other form of protection to prevent STIs (sexually transmitted infections).  Talk with your health care provider about taking a low-dose aspirin or statin.  Find healthy ways to cope with stress, such as: ? Meditation, yoga, or listening to music. ? Journaling. ? Talking to a trusted person. ? Spending time with friends and family. Safety  Always wear your seat belt while driving or riding in a vehicle.  Do not drive: ? If you have been drinking alcohol. Do not ride with someone who has been drinking. ? When you are tired or distracted. ? While texting.  Wear a helmet and other protective equipment during sports activities.  If you have firearms in your house, make sure you follow all gun safety procedures. What's next?  Visit your health care provider once a year for an annual wellness visit.  Ask your health care provider how often you should have your eyes and teeth checked.  Stay up to date on all vaccines. This information is not intended to replace advice given to you by your health care provider. Make sure you discuss any questions you have with your health care provider. Document Revised: 05/01/2019 Document Reviewed: 07/27/2018 Elsevier Patient Education  2021 [employer]  

## 2020-10-03 ENCOUNTER — Other Ambulatory Visit: Payer: Self-pay | Admitting: Family Medicine

## 2020-10-03 DIAGNOSIS — E7800 Pure hypercholesterolemia, unspecified: Secondary | ICD-10-CM

## 2020-10-03 DIAGNOSIS — I700 Atherosclerosis of aorta: Secondary | ICD-10-CM

## 2020-10-08 NOTE — Progress Notes (Signed)
Name: Lee Harvey   MRN: [id number]    DOB: 07-19-1951   Date:10/10/2020       Progress Note  Subjective  Chief Complaint  Follow up   HPI   OA right knee: he only takes Tylenol occasionally, he states walking more often, playing disk golf and has not noticed any instability lately. No effusion or redness. He states pain is mild and intermittent now   Pre-diabetes: last hgbA1C was5.9%no polyphagia, polydipsia or polyuria. He is eating healthier, he has always been walking 40 minutes 5 days a week or disk golf   Hyperlipidemia: taking Atorvastatin and denies side effects of medication,last labs showed LDL at goal at 69.No myalgia . We will repeat level in 6 months   Obesity: weight is down 7 lbs , he cooks at home, drinking more water and has been physically active. Doing well   Tremors : both hands and stable, worse when nervous, left side worse than right Not constant and stable. Discussed referral to neurologist but he would like to hold off. He states his aunt had tremors He denies balance problems or memory changes, or falls.     Atherosclerosis of Aorta and ectasy: repeat US in 2024, he is on statin therapy , last LDL at goal at 69   Senile purpura: usually on arms, reassurance given   Patient Active Problem List   Diagnosis Date Noted  . Senile purpura ([place]) 08/27/2020  . Dilation of aorta (HCC) 08/06/2018  . Atherosclerosis of aorta ([place]) 08/06/2018  . Right knee pain 10/20/2016  . Hyperlipidemia 04/22/2016  . Hyperglycemia 04/15/2016  . Difficulty hearing 04/13/2015  . Inguinal hernia 04/13/2015  . Tremor of both hands 04/13/2015  . Excess weight 04/13/2015    Past Surgical History:  Procedure Laterality Date  . CATARACT EXTRACTION, BILATERAL    . COLONOSCOPY  2015  . INGUINAL HERNIA REPAIR Left 11/30/2016   Procedure: HERNIA REPAIR INGUINAL ADULT;  Surgeon: Christene Lye, MD;  Location: [facility] ORS;  Service: General;  Laterality: Left;     Family History  Problem Relation Age of Onset  . Dementia Mother   . Cancer Father 19       Colon  . Thyroid disease Sister   . Mesothelioma Brother   . COPD Brother     Social History   Tobacco Use  . Smoking status: Former Smoker    Packs/day: 0.50    Years: 20.00    Pack years: 10.00    Types: Cigarettes    Start date: 08/16/1986    Quit date: 08/16/2006    Years since quitting: 14.1  . Smokeless tobacco: Never Used  . Tobacco comment: smoking cessation materials not required  Substance Use Topics  . Alcohol use: No    Alcohol/week: 0.0 standard drinks     Current Outpatient Medications:  .  acetaminophen (TYLENOL) 500 MG tablet, Take 1 tablet (500 mg total) by mouth every 6 (six) hours as needed. (Patient taking differently: Take 500 mg by mouth 2 (two) times daily.), Disp: 30 tablet, Rfl: 0 .  aspirin EC 81 MG tablet, Take 1 tablet (81 mg total) by mouth daily., Disp: 30 tablet, Rfl: 0 .  atorvastatin (LIPITOR) 40 MG tablet, TAKE 1 TABLET(40 MG) BY MOUTH DAILY, Disp: 90 tablet, Rfl: 1 .  ibuprofen (ADVIL) 600 MG tablet, TK 1 T PO Q 6 H AFTER SURGERY FOR 5 DAYS, Disp: , Rfl:  .  MULTIPLE VITAMIN PO, Take 1 tablet by mouth daily.,  Disp: , Rfl:   No Known Allergies  I personally reviewed active problem list, medication list, allergies, family history, social history, health maintenance with the patient/caregiver today.   ROS  Constitutional: Negative for fever or weight change.  Respiratory: Negative for cough and shortness of breath.   Cardiovascular: Negative for chest pain or palpitations.  Gastrointestinal: Negative for abdominal pain, no bowel changes.  Musculoskeletal: Negative for gait problem or joint swelling.  Skin: Negative for rash.  Neurological: Negative for dizziness or headache.  No other specific complaints in a complete review of systems (except as listed in HPI above).  Objective  Vitals:   10/10/20 0838  BP: 134/86  Pulse: 75  Resp: 16   Temp: 98.5 F (36.9 C)  TempSrc: Oral  SpO2: 96%  Weight: 230 lb (104.3 kg)  Height: 5\' 10"  (1.778 m)    Body mass index is 33 kg/m.  Physical Exam  Constitutional: Patient appears well-developed and well-nourished. Obese  No distress.  HEENT: head atraumatic, normocephalic, pupils equal and reactive to light,  neck supple Cardiovascular: Normal rate, regular rhythm and normal heart sounds.  No murmur heard. No BLE edema. Pulmonary/Chest: Effort normal and breath sounds normal. No respiratory distress. Abdominal: Soft.  There is no tenderness. Neurological: hand tremors more noticeable on left hand  Psychiatric: Patient has a normal mood and affect. behavior is normal. Judgment and thought content normal.  Recent Results (from the past 2160 hour(s))  PSA     Status: None   Collection Time: 08/27/20  9:28 AM  Result Value Ref Range   PSA 0.94 < OR = 4.0 ng/mL    Comment: The total PSA value from this assay system is  standardized against the WHO standard. The test  result will be approximately 20% lower when compared  to the equimolar-standardized total PSA (Beckman  Coulter). Comparison of serial PSA results should be  interpreted with this fact in mind. . This test was performed using the Siemens  chemiluminescent method. Values obtained from  different assay methods cannot be used interchangeably. PSA levels, regardless of value, should not be interpreted as absolute evidence of the presence or absence of disease.      PHQ2/9: Depression screen [facility] 2/9 10/10/2020 08/27/2020 04/22/2020 04/09/2020 07/31/2019  Decreased Interest 0 1 0 0 0  Down, Depressed, Hopeless 0 0 0 0 0  PHQ - 2 Score 0 1 0 0 0  Altered sleeping 0 1 - 0 0  Tired, decreased energy 0 0 - 0 0  Change in appetite 0 0 - 0 0  Feeling bad or failure about yourself  0 0 - 0 0  Trouble concentrating 0 0 - 0 0  Moving slowly or fidgety/restless 0 0 - 0 0  Suicidal thoughts 0 0 - 0 0  PHQ-9 Score 0 2 - 0  0  Difficult doing work/chores - Not difficult at all - - -    phq 9 is negative   Fall Risk: Fall Risk  10/10/2020 08/27/2020 04/22/2020 04/09/2020 07/31/2019  Falls in the past year? 0 0 0 0 0  Number falls in past yr: 0 0 0 0 0  Comment - - - - -  Injury with Fall? 0 0 0 0 0  Risk for fall due to : - - No Fall Risks - -  Risk for fall due to: Comment - - - - -  Follow up - - Falls prevention discussed - -     Functional Status  Survey: Is the patient deaf or have difficulty hearing?: Yes Does the patient have difficulty seeing, even when wearing glasses/contacts?: No Does the patient have difficulty concentrating, remembering, or making decisions?: No Does the patient have difficulty walking or climbing stairs?: No Does the patient have difficulty dressing or bathing?: No Does the patient have difficulty doing errands alone such as visiting a doctor's office or shopping?: No    Assessment & Plan  1. Atherosclerosis of aorta (HCC)  Continue statin and aspirin   2. Senile purpura (HCC)  Stable   3. Hyperglycemia  Changing diet and doing well   4. Pre-diabetes   5. Dilation of aorta (HCC)  Repeat test in 2024   6. Pure hypercholesterolemia   7. Tremor of both hands  Stable   8. Primary osteoarthritis of right knee  Doing better   9. Lower urinary tract symptoms (LUTS)  Doing better

## 2020-10-10 ENCOUNTER — Ambulatory Visit (INDEPENDENT_AMBULATORY_CARE_PROVIDER_SITE_OTHER): Payer: PPO | Admitting: Family Medicine

## 2020-10-10 ENCOUNTER — Encounter: Payer: Self-pay | Admitting: Family Medicine

## 2020-10-10 ENCOUNTER — Other Ambulatory Visit: Payer: Self-pay

## 2020-10-10 VITALS — BP 134/86 | HR 75 | Temp 98.5°F | Resp 16 | Ht 70.0 in | Wt 230.0 lb

## 2020-10-10 DIAGNOSIS — E7800 Pure hypercholesterolemia, unspecified: Secondary | ICD-10-CM

## 2020-10-10 DIAGNOSIS — R399 Unspecified symptoms and signs involving the genitourinary system: Secondary | ICD-10-CM | POA: Diagnosis not present

## 2020-10-10 DIAGNOSIS — I77819 Aortic ectasia, unspecified site: Secondary | ICD-10-CM

## 2020-10-10 DIAGNOSIS — M1711 Unilateral primary osteoarthritis, right knee: Secondary | ICD-10-CM | POA: Diagnosis not present

## 2020-10-10 DIAGNOSIS — R251 Tremor, unspecified: Secondary | ICD-10-CM | POA: Diagnosis not present

## 2020-10-10 DIAGNOSIS — R739 Hyperglycemia, unspecified: Secondary | ICD-10-CM | POA: Diagnosis not present

## 2020-10-10 DIAGNOSIS — I700 Atherosclerosis of aorta: Secondary | ICD-10-CM | POA: Diagnosis not present

## 2020-10-10 DIAGNOSIS — R7303 Prediabetes: Secondary | ICD-10-CM | POA: Diagnosis not present

## 2020-10-10 DIAGNOSIS — D692 Other nonthrombocytopenic purpura: Secondary | ICD-10-CM

## 2020-11-18 IMAGING — US US ABDOMINAL AORTA SCREENING AAA
1 series · 14 of 15 positions shown · non-contrast
Comparison: None.

CLINICAL DATA: Male between 65-75 years of age with a smoking
history.

EXAM:
US ABDOMINAL AORTA MEDICARE SCREENING
TECHNIQUE: Ultrasound examination of the abdominal aorta was performed as a
screening evaluation for abdominal aortic aneurysm.

[Series 1: us abdominal aorta screening aaa · 0.26mm/px · 14 of 15 slices shown]
[im 1/15]
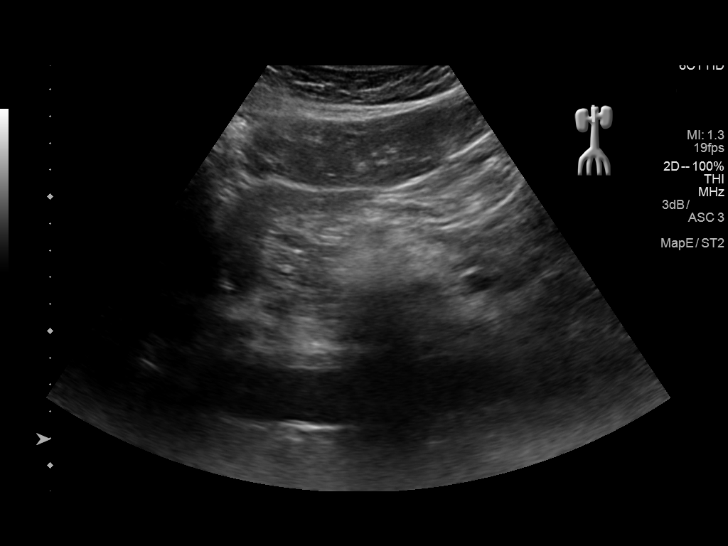
[im 2/15]
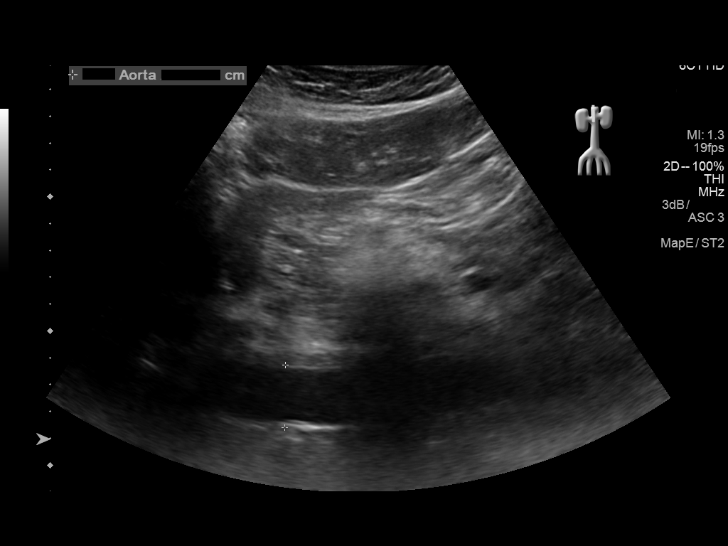
[im 3/15]
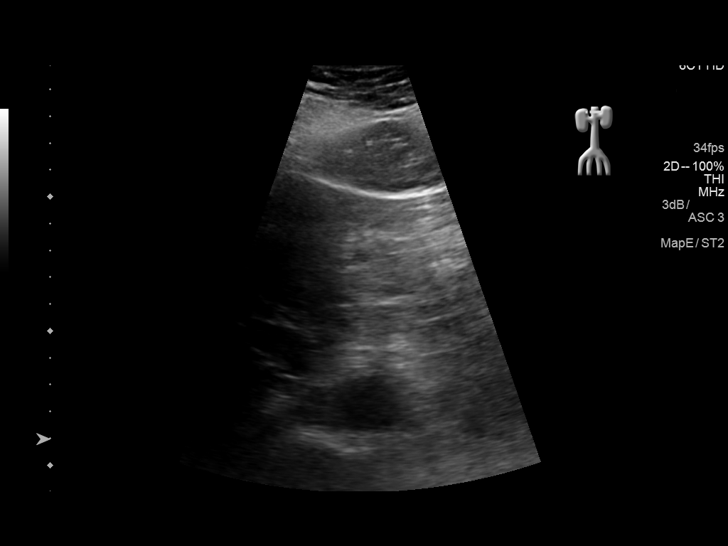
[im 4/15]
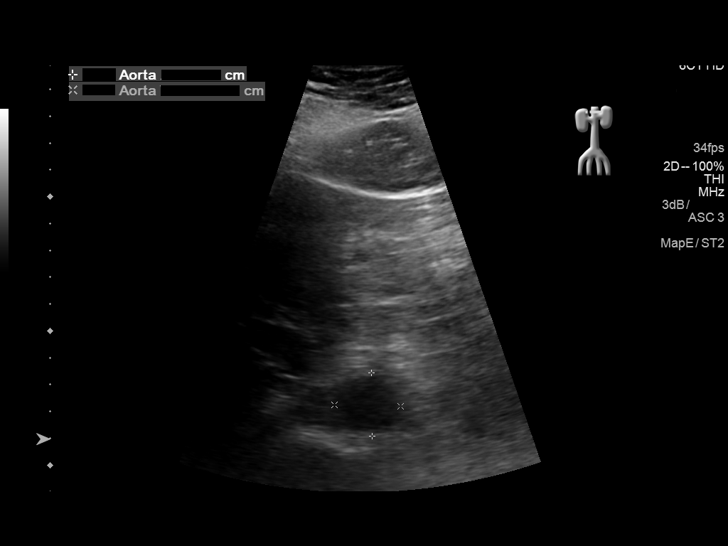
[im 5/15]
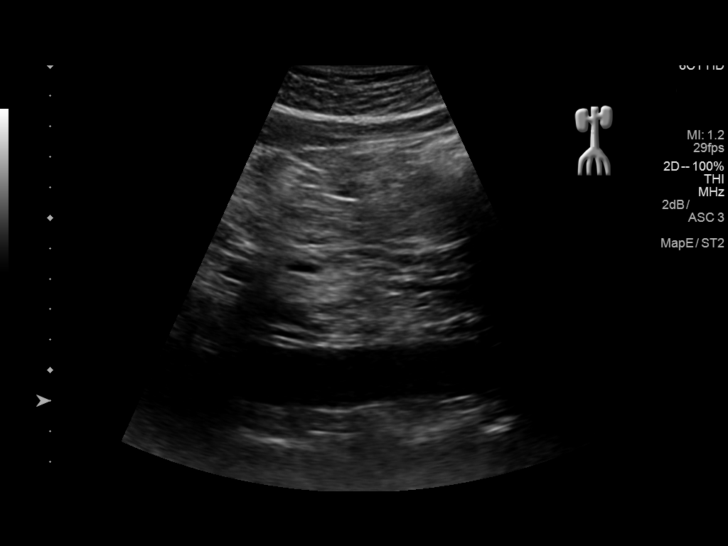
[im 6/15]
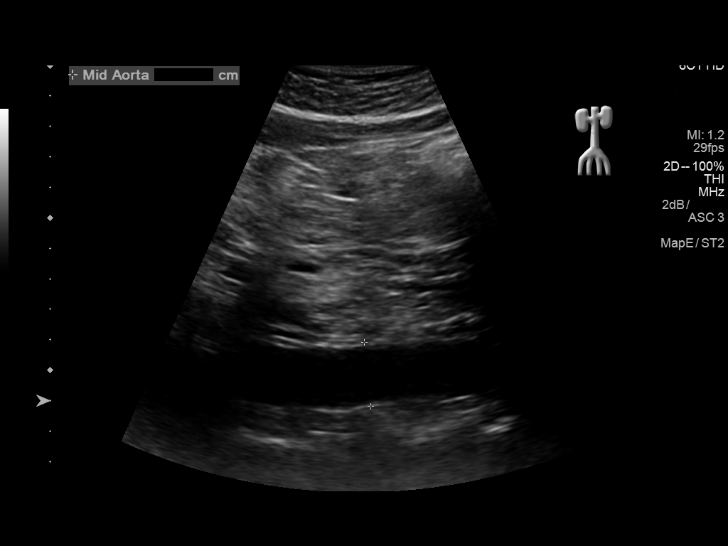
[im 7/15]
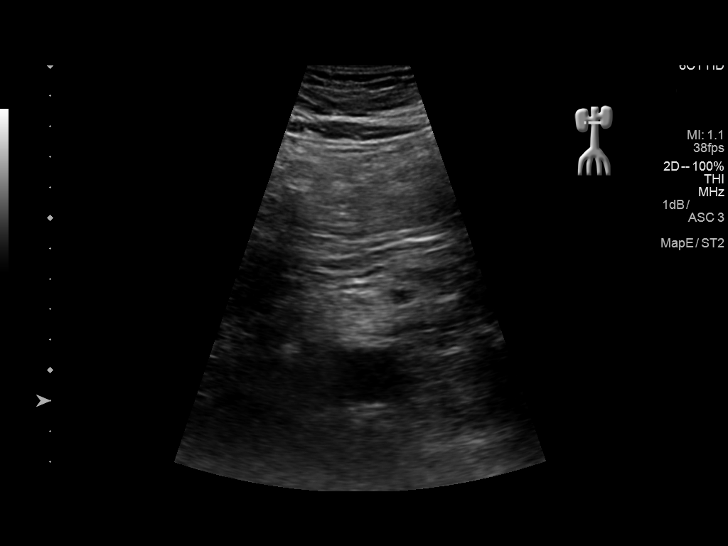
[im 9/15]
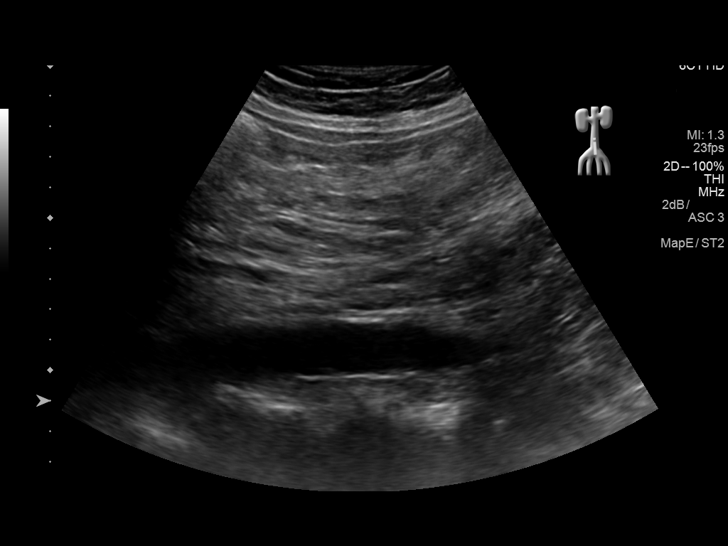
[im 10/15]
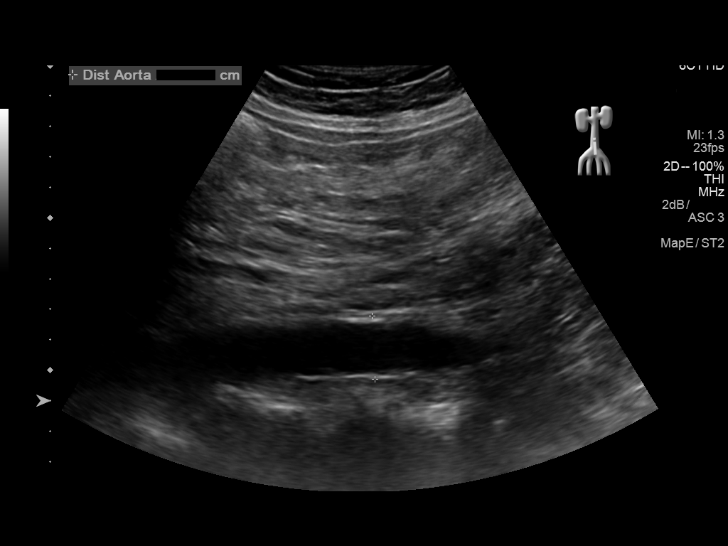
[im 11/15]
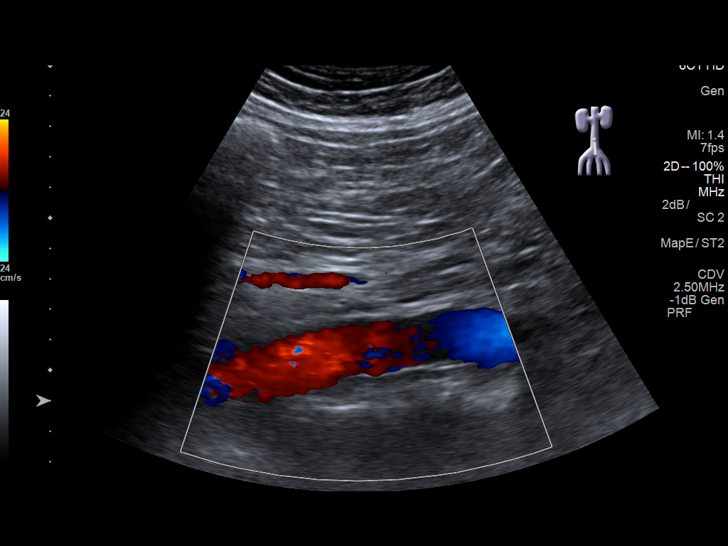
[im 12/15]
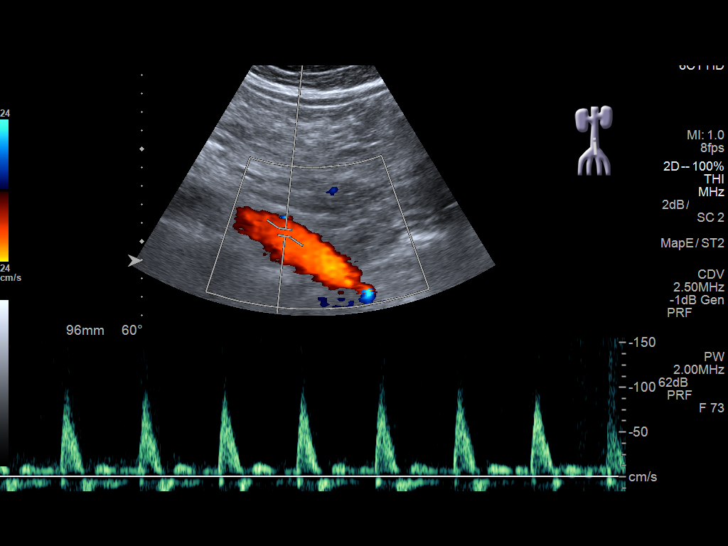
[im 13/15]
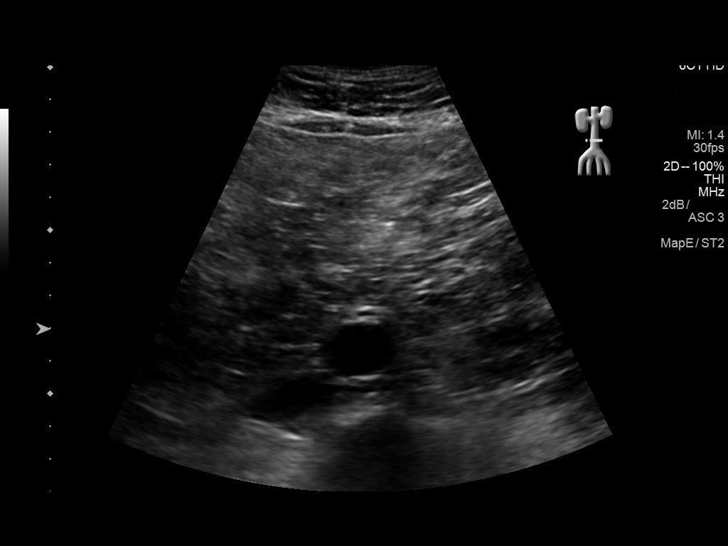
[im 14/15]
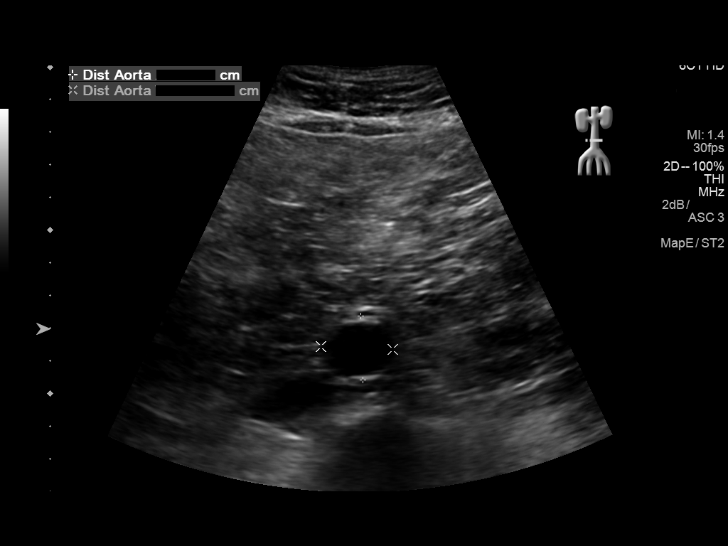
[im 15/15]
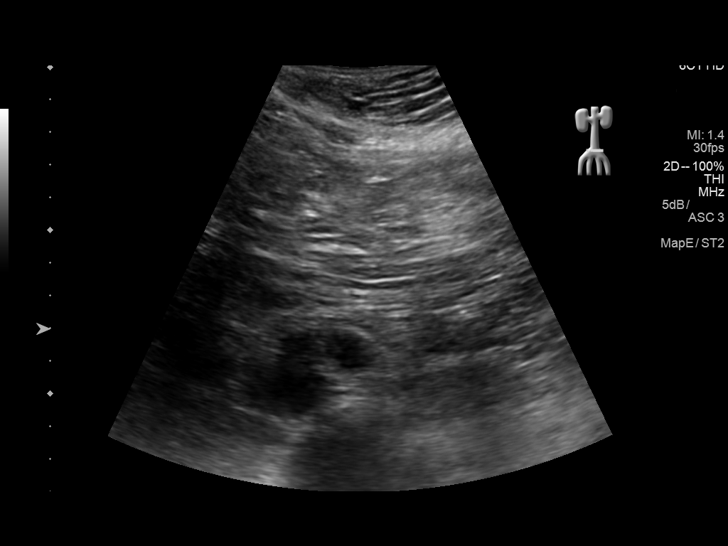

[14 of 15 positions shown; findings below may reference images not displayed]

FINDINGS: Abdominal aortic measurements as follows:

Proximal:  2.5 cm

Mid:  2.2 cm

Distal:  2.2 cm
IMPRESSION: Minor aortic atherosclerosis and proximal abdominal aorta ectasia,
maximal diameter 2.5 cm.

Ectatic abdominal aorta at risk for aneurysm development. Recommend
followup by ultrasound in 5 years. This recommendation follows ACR
consensus guidelines: White Paper of the ACR Incidental Findings
Committee II on Vascular Findings. [HOSPITAL] 0129;

## 2020-11-26 DIAGNOSIS — C4401 Basal cell carcinoma of skin of lip: Secondary | ICD-10-CM | POA: Diagnosis not present

## 2020-11-26 DIAGNOSIS — D225 Melanocytic nevi of trunk: Secondary | ICD-10-CM | POA: Diagnosis not present

## 2020-11-26 DIAGNOSIS — D485 Neoplasm of uncertain behavior of skin: Secondary | ICD-10-CM | POA: Diagnosis not present

## 2020-11-26 DIAGNOSIS — L821 Other seborrheic keratosis: Secondary | ICD-10-CM | POA: Diagnosis not present

## 2020-11-26 DIAGNOSIS — L578 Other skin changes due to chronic exposure to nonionizing radiation: Secondary | ICD-10-CM | POA: Diagnosis not present

## 2020-12-02 ENCOUNTER — Telehealth: Payer: Self-pay

## 2020-12-02 NOTE — Telephone Encounter (Signed)
Copied from [place] [id number]. Topic: General - Other >> Dec 02, 2020 10:28 AM Yvette Rack wrote: Reason for CRM: Pt stated he will be having a procedure for skin cancer on May 2 and he was instructed to contact pcp for approval to stop aspirin EC 81 MG tablet at least 1 week prior to the procedure. Pt stated they would like for it to approved as soon as possible but no less than a week prior to the procedure. Pt requests call back

## 2020-12-02 NOTE — Telephone Encounter (Signed)
Spoke with the patient and he acknowledge he understood that he could stop the aspirin

## 2020-12-15 DIAGNOSIS — C4401 Basal cell carcinoma of skin of lip: Secondary | ICD-10-CM | POA: Diagnosis not present

## 2020-12-25 ENCOUNTER — Encounter: Payer: Self-pay | Admitting: Family Medicine

## 2021-01-14 ENCOUNTER — Ambulatory Visit: Payer: Self-pay

## 2021-01-14 DIAGNOSIS — Z48817 Encounter for surgical aftercare following surgery on the skin and subcutaneous tissue: Secondary | ICD-10-CM | POA: Diagnosis not present

## 2021-01-14 NOTE — Telephone Encounter (Signed)
Pt. Seeing blood after bowel movements starting Sunday. Moderate amount. States he had this "years ago and it was hemorrhoids." Has some itching, no pain. No availability. Pt. Wants to know if he can be worked in. Call nephew at [phone].Practice closed for lunch presently.  Reason for Disposition . MODERATE rectal bleeding (small blood clots, passing blood without stool, or toilet water turns red)  Answer Assessment - Initial Assessment Questions 1. APPEARANCE of BLOOD: "What color is it?" "Is it passed separately, on the surface of the stool, or mixed in with the stool?"      Red, wipe 2. AMOUNT: "How much blood was passed?"      Moderate 3. FREQUENCY: "How many times has blood been passed with the stools?"      1 4. ONSET: "When was the blood first seen in the stools?" (Days or weeks)      Sunday 5. DIARRHEA: "Is there also some diarrhea?" If Yes, ask: "How many diarrhea stools in the past 24 hours?"      No 6. CONSTIPATION: "Do you have constipation?" If Yes, ask: "How bad is it?"     No 7. RECURRENT SYMPTOMS: "Have you had blood in your stools before?" If Yes, ask: "When was the last time?" and "What happened that time?"      yes 8. BLOOD THINNERS: "Do you take any blood thinners?" (e.g., Coumadin/warfarin, Pradaxa/dabigatran, aspirin)     No 9. OTHER SYMPTOMS: "Do you have any other symptoms?"  (e.g., abdomen pain, vomiting, dizziness, fever)     No 10. PREGNANCY: "Is there any chance you are pregnant?" "When was your last menstrual period?"       n/a  Protocols used: RECTAL BLEEDING-A-AH

## 2021-01-15 NOTE — Telephone Encounter (Signed)
Spoke with Patient's nephew, Merry Proud. He was in agreement to have Mr. Rodenberg evaluated at [facility]. He will follow up with [place] and let us know outcome.

## 2021-03-30 ENCOUNTER — Other Ambulatory Visit: Payer: Self-pay | Admitting: Family Medicine

## 2021-03-30 DIAGNOSIS — I700 Atherosclerosis of aorta: Secondary | ICD-10-CM

## 2021-03-30 DIAGNOSIS — E7800 Pure hypercholesterolemia, unspecified: Secondary | ICD-10-CM

## 2021-04-02 ENCOUNTER — Other Ambulatory Visit: Payer: Self-pay | Admitting: Family Medicine

## 2021-04-02 DIAGNOSIS — E7800 Pure hypercholesterolemia, unspecified: Secondary | ICD-10-CM

## 2021-04-02 DIAGNOSIS — I700 Atherosclerosis of aorta: Secondary | ICD-10-CM

## 2021-04-02 NOTE — Telephone Encounter (Signed)
Next appt is 04/15/21

## 2021-04-02 NOTE — Telephone Encounter (Signed)
   Notes to clinic:  ZERO refills remain on this prescription. Your patient is requesting advance approval of refills for this medication to [facility]   Requested Prescriptions  Pending Prescriptions Disp Refills   atorvastatin (LIPITOR) 40 MG tablet [Pharmacy Med Name: ATORVASTATIN '40MG'$  TABLETS] 90 tablet 0    Sig: TAKE 1 TABLET(40 MG) BY MOUTH DAILY     Cardiovascular:  Antilipid - Statins Passed - 04/02/2021  8:09 AM      Passed - Total Cholesterol in normal range and within 360 days    Cholesterol, Total  Date Value Ref Range Status  04/14/2015 148 100 - 199 mg/dL Final   Cholesterol  Date Value Ref Range Status  04/09/2020 137 <200 mg/dL Final          Passed - LDL in normal range and within 360 days    LDL Cholesterol (Calc)  Date Value Ref Range Status  04/09/2020 69 mg/dL (calc) Final    Comment:    Reference range: <100 . Desirable range <100 mg/dL for primary prevention;   <70 mg/dL for patients with CHD or diabetic patients  with > or = 2 CHD risk factors. Marland Kitchen LDL-C is now calculated using the Martin-Hopkins  calculation, which is a validated novel method providing  better accuracy than the Friedewald equation in the  estimation of LDL-C.  Cresenciano Genre et al. Annamaria Helling. [id number]): 2061-2068  (http://education.[facility].com/faq/FAQ164)           Passed - HDL in normal range and within 360 days    HDL  Date Value Ref Range Status  04/09/2020 50 > OR = 40 mg/dL Final  04/14/2015 57 >39 mg/dL Final    Comment:    According to ATP-III Guidelines, HDL-C >59 mg/dL is considered a negative risk factor for CHD.           Passed - Triglycerides in normal range and within 360 days    Triglycerides  Date Value Ref Range Status  04/09/2020 97 <150 mg/dL Final          Passed - Patient is not pregnant      Passed - Valid encounter within last 12 months    Recent Outpatient Visits           5 months ago Senile purpura [facility])   [facility] Steele Sizer, MD   7 months ago Encounter for [employer] annual wellness exam   [facility] Steele Sizer, MD   11 months ago Atherosclerosis of aorta [facility])   [facility] Steele Sizer, MD   1 year ago Atherosclerosis of aorta [facility])   [facility] Steele Sizer, MD   2 years ago Pure hypercholesterolemia   [facility] Steele Sizer, MD       Future Appointments             In 1 week Steele Sizer, MD [facility], [place]   In 3 weeks  [place]

## 2021-04-10 ENCOUNTER — Ambulatory Visit: Payer: PPO | Admitting: Family Medicine

## 2021-04-14 NOTE — Progress Notes (Signed)
Name: Lee Harvey   MRN: [id number]    DOB: Jul 29, 1951   Date:04/15/2021       Progress Note  Subjective  Chief Complaint  Follow Up  HPI  OA right knee: he only takes Tylenol occasionally, he states walking more often.  He states pain has been more often lately, usually after moderate activity.Pain is described as aching, sometimes has effusion but no redness .   Pre-diabetes : last hgbA1C was 5.9%  no polyphagia, polydipsia or polyuria. He is eating healthier, he not doing disc golf lately due to the heat but has been walking 3-4 times per week for 20-30 minutes,  continue life style modifications and recheck A1C today    Hyperlipidemia: taking Atorvastatin and denies side effects of medication, last labs showed LDL at goal at 69. No myalgia . We will repeat level today    Obesity: weight is stable now,  he cooks at home, drinking more water and has been physically active.    Tremors : both hands and stable, worse when nervous, left side worse than right  Not constant and stable. He states his aunt had tremors He denies balance problems or memory changes, or falls. He is now wiling to see neurologist since it is starting to affect him when trying to play putt putt.      Atherosclerosis of Aorta and Aorta ectasy: repeat US in 2024, he is on statin therapy , last LDL at goal at 69 and we will recheck level today. Denies myopahty   Senile purpura: usually on arms. Unchanged   Patient Active Problem List   Diagnosis Date Noted   Senile purpura ([place]) 08/27/2020   Dilation of aorta ([place]) 08/06/2018   Atherosclerosis of aorta ([place]) 08/06/2018   Right knee pain 10/20/2016   Hyperlipidemia 04/22/2016   Hyperglycemia 04/15/2016   Difficulty hearing 04/13/2015   Inguinal hernia 04/13/2015   Tremor of both hands 04/13/2015   Excess weight 04/13/2015    Past Surgical History:  Procedure Laterality Date   CATARACT EXTRACTION, BILATERAL     COLONOSCOPY  2015   INGUINAL HERNIA REPAIR  Left 11/30/2016   Procedure: HERNIA REPAIR INGUINAL ADULT;  Surgeon: Christene Lye, MD;  Location: [facility] ORS;  Service: General;  Laterality: Left;    Family History  Problem Relation Age of Onset   Dementia Mother    Cancer Father 1       Colon   Thyroid disease Sister    Mesothelioma Brother    COPD Brother     Social History   Tobacco Use   Smoking status: Former    Packs/day: 0.50    Years: 20.00    Pack years: 10.00    Types: Cigarettes    Start date: 08/16/1986    Quit date: 08/16/2006    Years since quitting: 14.6   Smokeless tobacco: Never   Tobacco comments:    smoking cessation materials not required  Substance Use Topics   Alcohol use: No    Alcohol/week: 0.0 standard drinks     Current Outpatient Medications:    acetaminophen (TYLENOL) 500 MG tablet, Take 1 tablet (500 mg total) by mouth every 6 (six) hours as needed. (Patient taking differently: Take 500 mg by mouth 2 (two) times daily.), Disp: 30 tablet, Rfl: 0   aspirin EC 81 MG tablet, Take 1 tablet (81 mg total) by mouth daily., Disp: 30 tablet, Rfl: 0   atorvastatin (LIPITOR) 40 MG tablet, TAKE 1 TABLET(40 MG) BY MOUTH  DAILY, Disp: 90 tablet, Rfl: 0   ibuprofen (ADVIL) 600 MG tablet, TK 1 T PO Q 6 H AFTER SURGERY FOR 5 DAYS, Disp: , Rfl:    MULTIPLE VITAMIN PO, Take 1 tablet by mouth daily., Disp: , Rfl:   No Known Allergies  I personally reviewed active problem list, medication list, allergies, family history, social history, health maintenance with the patient/caregiver today.   ROS  Constitutional: Negative for fever or weight change.  Respiratory: Negative for cough and shortness of breath.   Cardiovascular: Negative for chest pain or palpitations.  Gastrointestinal: Negative for abdominal pain, no bowel changes.  Musculoskeletal: Negative for gait problem or joint swelling.  Skin: Negative for rash.  Neurological: Negative for dizziness or headache.  No other specific complaints in a  complete review of systems (except as listed in HPI above).   Objective  Vitals:   04/15/21 0812  BP: 130/84  Pulse: 67  Resp: 16  Temp: 98.1 F (36.7 C)  SpO2: 95%  Weight: 230 lb (104.3 kg)  Height: '5\' 10"'$  (1.778 m)    Body mass index is 33 kg/m.  Physical Exam  Constitutional: Patient appears well-developed and well-nourished. Obese  No distress.  HEENT: head atraumatic, normocephalic, pupils equal and reactive to light, neck supple Cardiovascular: Normal rate, regular rhythm and normal heart sounds.  No murmur heard. No BLE edema. Pulmonary/Chest: Effort normal and breath sounds normal. No respiratory distress. Abdominal: Soft.  There is no tenderness. Neurological: fine tremors of hands during rest or activity Psychiatric: Patient has a normal mood and affect. behavior is normal. Judgment and thought content normal.    PHQ2/9: Depression screen [facility] 2/9 04/15/2021 10/10/2020 08/27/2020 04/22/2020 04/09/2020  Decreased Interest 0 0 1 0 0  Down, Depressed, Hopeless 0 0 0 0 0  PHQ - 2 Score 0 0 1 0 0  Altered sleeping - 0 1 - 0  Tired, decreased energy - 0 0 - 0  Change in appetite - 0 0 - 0  Feeling bad or failure about yourself  - 0 0 - 0  Trouble concentrating - 0 0 - 0  Moving slowly or fidgety/restless - 0 0 - 0  Suicidal thoughts - 0 0 - 0  PHQ-9 Score - 0 2 - 0  Difficult doing work/chores - - Not difficult at all - -  Some recent data might be hidden    phq 9 is negative   Fall Risk: Fall Risk  04/15/2021 10/10/2020 08/27/2020 04/22/2020 04/09/2020  Falls in the past year? 0 0 0 0 0  Number falls in past yr: 0 0 0 0 0  Comment - - - - -  Injury with Fall? 0 0 0 0 0  Risk for fall due to : No Fall Risks - - No Fall Risks -  Risk for fall due to: Comment - - - - -  Follow up Falls prevention discussed - - Falls prevention discussed -     Functional Status Survey: Is the patient deaf or have difficulty hearing?: Yes Does the patient have difficulty seeing,  even when wearing glasses/contacts?: No Does the patient have difficulty concentrating, remembering, or making decisions?: No Does the patient have difficulty walking or climbing stairs?: No Does the patient have difficulty dressing or bathing?: No Does the patient have difficulty doing errands alone such as visiting a doctor's office or shopping?: No    Assessment & Plan  1. Dilation of aorta ([place])  Recheck echo in 07/2023  2. Need for immunization against influenza  - Flu Vaccine QUAD High Dose(Fluad)  3. Colon cancer screening  - Cologuard  4. Pre-diabetes   5. Hyperglycemia  - Hemoglobin A1c  6. Senile purpura (HCC)  Stable   7. Atherosclerosis of aorta (HCC)  - Lipid panel  8. Tremor of both hands  - Ambulatory referral to Neurology  9. Primary osteoarthritis of right knee   10. Long-term use of high-risk medication  - CBC with Differential/Platelet - COMPLETE METABOLIC PANEL WITH GFR  11. Need for shingles vaccine  - Zoster Vaccine Adjuvanted [facility]) injection; Inject 0.5 mLs into the muscle once for 1 dose.  Dispense: 0.5 mL; Refill: 1

## 2021-04-15 ENCOUNTER — Other Ambulatory Visit: Payer: Self-pay

## 2021-04-15 ENCOUNTER — Encounter: Payer: Self-pay | Admitting: Family Medicine

## 2021-04-15 ENCOUNTER — Ambulatory Visit (INDEPENDENT_AMBULATORY_CARE_PROVIDER_SITE_OTHER): Payer: PPO | Admitting: Family Medicine

## 2021-04-15 VITALS — BP 130/84 | HR 67 | Temp 98.1°F | Resp 16 | Ht 70.0 in | Wt 230.0 lb

## 2021-04-15 DIAGNOSIS — R251 Tremor, unspecified: Secondary | ICD-10-CM

## 2021-04-15 DIAGNOSIS — Z1211 Encounter for screening for malignant neoplasm of colon: Secondary | ICD-10-CM

## 2021-04-15 DIAGNOSIS — D692 Other nonthrombocytopenic purpura: Secondary | ICD-10-CM | POA: Diagnosis not present

## 2021-04-15 DIAGNOSIS — Z23 Encounter for immunization: Secondary | ICD-10-CM | POA: Diagnosis not present

## 2021-04-15 DIAGNOSIS — R739 Hyperglycemia, unspecified: Secondary | ICD-10-CM | POA: Diagnosis not present

## 2021-04-15 DIAGNOSIS — M1711 Unilateral primary osteoarthritis, right knee: Secondary | ICD-10-CM

## 2021-04-15 DIAGNOSIS — Z79899 Other long term (current) drug therapy: Secondary | ICD-10-CM

## 2021-04-15 DIAGNOSIS — I77819 Aortic ectasia, unspecified site: Secondary | ICD-10-CM | POA: Diagnosis not present

## 2021-04-15 DIAGNOSIS — I700 Atherosclerosis of aorta: Secondary | ICD-10-CM

## 2021-04-15 DIAGNOSIS — R7303 Prediabetes: Secondary | ICD-10-CM | POA: Diagnosis not present

## 2021-04-15 MED ORDER — SHINGRIX 50 MCG/0.5ML IM SUSR: 0.5000 mL | Freq: Once | INTRAMUSCULAR | 1 refills | Status: AC

## 2021-04-16 LAB — COMPLETE METABOLIC PANEL WITH GFR
AG Ratio: 2.1 (calc) (ref 1.0–2.5)
ALT: 25 U/L (ref 9–46)
AST: 19 U/L (ref 10–35)
Albumin: 4.4 g/dL (ref 3.6–5.1)
Alkaline phosphatase (APISO): 115 U/L (ref 35–144)
BUN: 12 mg/dL (ref 7–25)
CO2: 28 mmol/L (ref 20–32)
Calcium: 9 mg/dL (ref 8.6–10.3)
Chloride: 105 mmol/L (ref 98–110)
Creat: 1.12 mg/dL (ref 0.70–1.35)
Globulin: 2.1 g/dL (calc) (ref 1.9–3.7)
Glucose, Bld: 96 mg/dL (ref 65–99)
Potassium: 4.4 mmol/L (ref 3.5–5.3)
Sodium: 140 mmol/L (ref 135–146)
Total Bilirubin: 0.8 mg/dL (ref 0.2–1.2)
Total Protein: 6.5 g/dL (ref 6.1–8.1)
eGFR: 72 mL/min/{1.73_m2} (ref >=60)

## 2021-04-16 LAB — CBC WITH DIFFERENTIAL/PLATELET
Absolute Monocytes: 656 cells/uL (ref 200–950)
Basophils Absolute: 66 cells/uL (ref 0–200)
Basophils Relative: 0.8 %
Eosinophils Absolute: 257 cells/uL (ref 15–500)
Eosinophils Relative: 3.1 %
HCT: 43.7 % (ref 38.5–50.0)
Hemoglobin: 14.5 g/dL (ref 13.2–17.1)
Lymphs Abs: 2017 cells/uL (ref 850–3900)
MCH: 31.3 pg (ref 27.0–33.0)
MCHC: 33.2 g/dL (ref 32.0–36.0)
MCV: 94.4 fL (ref 80.0–100.0)
MPV: 11 fL (ref 7.5–12.5)
Monocytes Relative: 7.9 %
Neutro Abs: 5304 cells/uL (ref 1500–7800)
Neutrophils Relative %: 63.9 %
Platelets: 177 10*3/uL (ref 140–400)
RBC: 4.63 10*6/uL (ref 4.20–5.80)
RDW: 12.5 % (ref 11.0–15.0)
Total Lymphocyte: 24.3 %
WBC: 8.3 10*3/uL (ref 3.8–10.8)

## 2021-04-16 LAB — LIPID PANEL
Cholesterol: 124 mg/dL (ref <200)
HDL: 50 mg/dL (ref >=40)
LDL Cholesterol (Calc): 57 mg/dL (calc)
Non-HDL Cholesterol (Calc): 74 mg/dL (calc) (ref <130)
Total CHOL/HDL Ratio: 2.5 (calc) (ref <5.0)
Triglycerides: 85 mg/dL (ref <150)

## 2021-04-16 LAB — HEMOGLOBIN A1C
Hgb A1c MFr Bld: 5.8 % of total Hgb — ABNORMAL HIGH (ref <5.7)
Mean Plasma Glucose: 120 mg/dL
eAG (mmol/L): 6.6 mmol/L

## 2021-04-21 DIAGNOSIS — Z1211 Encounter for screening for malignant neoplasm of colon: Secondary | ICD-10-CM | POA: Diagnosis not present

## 2021-04-23 ENCOUNTER — Ambulatory Visit (INDEPENDENT_AMBULATORY_CARE_PROVIDER_SITE_OTHER): Payer: PPO

## 2021-04-23 ENCOUNTER — Ambulatory Visit: Payer: PPO

## 2021-04-23 ENCOUNTER — Other Ambulatory Visit: Payer: Self-pay

## 2021-04-23 VITALS — BP 130/80 | HR 71 | Temp 98.2°F | Resp 16 | Ht 70.0 in | Wt 229.3 lb

## 2021-04-23 DIAGNOSIS — Z0000 Encounter for general adult medical examination without abnormal findings: Secondary | ICD-10-CM | POA: Diagnosis not present

## 2021-04-23 NOTE — Patient Instructions (Signed)
Lee Harvey , Thank you for taking time to come for your Medicare Wellness Visit. I appreciate your ongoing commitment to your health goals. Please review the following plan we discussed and let me know if I can assist you in the future.   Screening recommendations/referrals: Colonoscopy: Cologuard completed 04/22/21; pending results Recommended yearly ophthalmology/optometry visit for glaucoma screening and checkup Recommended yearly dental visit for hygiene and checkup  Vaccinations: Influenza vaccine: done 04/15/21 Pneumococcal vaccine: done 01/18/19 Tdap vaccine: done 04/11/14 Shingles vaccine: Shingrix discussed. Please contact your pharmacy for coverage information.  Covid-19:  done 09/26/19 & 10/17/19; due for booster  Advanced directives: Advance directive discussed with you today. I have provided a copy for you to complete at home and have notarized. Once this is complete please bring a copy in to our office so we can scan it into your chart.   Conditions/risks identified: Keep up the great work!  Next appointment: Follow up in one year for your annual wellness visit.   Preventive Care 22 Years and Older, Male Preventive care refers to lifestyle choices and visits with your health care provider that can promote health and wellness. What does preventive care include? A yearly physical exam. This is also called an annual well check. Dental exams once or twice a year. Routine eye exams. Ask your health care provider how often you should have your eyes checked. Personal lifestyle choices, including: Daily care of your teeth and gums. Regular physical activity. Eating a healthy diet. Avoiding tobacco and drug use. Limiting alcohol use. Practicing safe sex. Taking low doses of aspirin every day. Taking vitamin and mineral supplements as recommended by your health care provider. What happens during an annual well check? The services and screenings done by your health care provider during  your annual well check will depend on your age, overall health, lifestyle risk factors, and family history of disease. Counseling  Your health care provider may ask you questions about your: Alcohol use. Tobacco use. Drug use. Emotional well-being. Home and relationship well-being. Sexual activity. Eating habits. History of falls. Memory and ability to understand (cognition). Work and work Statistician. Screening  You may have the following tests or measurements: Height, weight, and BMI. Blood pressure. Lipid and cholesterol levels. These may be checked every 5 years, or more frequently if you are over 38 years old. Skin check. Lung cancer screening. You may have this screening every year starting at age 67 if you have a 30-pack-year history of smoking and currently smoke or have quit within the past 15 years. Fecal occult blood test (FOBT) of the stool. You may have this test every year starting at age 69. Flexible sigmoidoscopy or colonoscopy. You may have a sigmoidoscopy every 5 years or a colonoscopy every 10 years starting at age 26. Prostate cancer screening. Recommendations will vary depending on your family history and other risks. Hepatitis C blood test. Hepatitis B blood test. Sexually transmitted disease (STD) testing. Diabetes screening. This is done by checking your blood sugar (glucose) after you have not eaten for a while (fasting). You may have this done every 1-3 years. Abdominal aortic aneurysm (AAA) screening. You may need this if you are a current or former smoker. Osteoporosis. You may be screened starting at age 49 if you are at high risk. Talk with your health care provider about your test results, treatment options, and if necessary, the need for more tests. Vaccines  Your health care provider may recommend certain vaccines, such as: Influenza vaccine. This  is recommended every year. Tetanus, diphtheria, and acellular pertussis (Tdap, Td) vaccine. You may need  a Td booster every 10 years. Zoster vaccine. You may need this after age 74. Pneumococcal 13-valent conjugate (PCV13) vaccine. One dose is recommended after age 11. Pneumococcal polysaccharide (PPSV23) vaccine. One dose is recommended after age 70. Talk to your health care provider about which screenings and vaccines you need and how often you need them. This information is not intended to replace advice given to you by your health care provider. Make sure you discuss any questions you have with your health care provider. Document Released: 08/29/2015 Document Revised: 04/21/2016 Document Reviewed: 06/03/2015 Elsevier Interactive Patient Education  2017 [place] Prevention in the Home Falls can cause injuries. They can happen to people of all ages. There are many things you can do to make your home safe and to help prevent falls. What can I do on the outside of my home? Regularly fix the edges of walkways and driveways and fix any cracks. Remove anything that might make you trip as you walk through a door, such as a raised step or threshold. Trim any bushes or trees on the path to your home. Use bright outdoor lighting. Clear any walking paths of anything that might make someone trip, such as rocks or tools. Regularly check to see if handrails are loose or broken. Make sure that both sides of any steps have handrails. Any raised decks and porches should have guardrails on the edges. Have any leaves, snow, or ice cleared regularly. Use sand or salt on walking paths during winter. Clean up any spills in your garage right away. This includes oil or grease spills. What can I do in the bathroom? Use night lights. Install grab bars by the toilet and in the tub and shower. Do not use towel bars as grab bars. Use non-skid mats or decals in the tub or shower. If you need to sit down in the shower, use a plastic, non-slip stool. Keep the floor dry. Clean up any water that spills on the  floor as soon as it happens. Remove soap buildup in the tub or shower regularly. Attach bath mats securely with double-sided non-slip rug tape. Do not have throw rugs and other things on the floor that can make you trip. What can I do in the bedroom? Use night lights. Make sure that you have a light by your bed that is easy to reach. Do not use any sheets or blankets that are too big for your bed. They should not hang down onto the floor. Have a firm chair that has side arms. You can use this for support while you get dressed. Do not have throw rugs and other things on the floor that can make you trip. What can I do in the kitchen? Clean up any spills right away. Avoid walking on wet floors. Keep items that you use a lot in easy-to-reach places. If you need to reach something above you, use a strong step stool that has a grab bar. Keep electrical cords out of the way. Do not use floor polish or wax that makes floors slippery. If you must use wax, use non-skid floor wax. Do not have throw rugs and other things on the floor that can make you trip. What can I do with my stairs? Do not leave any items on the stairs. Make sure that there are handrails on both sides of the stairs and use them. Fix handrails that  are broken or loose. Make sure that handrails are as long as the stairways. Check any carpeting to make sure that it is firmly attached to the stairs. Fix any carpet that is loose or worn. Avoid having throw rugs at the top or bottom of the stairs. If you do have throw rugs, attach them to the floor with carpet tape. Make sure that you have a light switch at the top of the stairs and the bottom of the stairs. If you do not have them, ask someone to add them for you. What else can I do to help prevent falls? Wear shoes that: Do not have high heels. Have rubber bottoms. Are comfortable and fit you well. Are closed at the toe. Do not wear sandals. If you use a stepladder: Make sure that  it is fully opened. Do not climb a closed stepladder. Make sure that both sides of the stepladder are locked into place. Ask someone to hold it for you, if possible. Clearly mark and make sure that you can see: Any grab bars or handrails. First and last steps. Where the edge of each step is. Use tools that help you move around (mobility aids) if they are needed. These include: Canes. Walkers. Scooters. Crutches. Turn on the lights when you go into a dark area. Replace any light bulbs as soon as they burn out. Set up your furniture so you have a clear path. Avoid moving your furniture around. If any of your floors are uneven, fix them. If there are any pets around you, be aware of where they are. Review your medicines with your doctor. Some medicines can make you feel dizzy. This can increase your chance of falling. Ask your doctor what other things that you can do to help prevent falls. This information is not intended to replace advice given to you by your health care provider. Make sure you discuss any questions you have with your health care provider. Document Released: 05/29/2009 Document Revised: 01/08/2016 Document Reviewed: 09/06/2014 Elsevier Interactive Patient Education  2017 [employer].

## 2021-04-23 NOTE — Progress Notes (Signed)
Subjective:   Lee Harvey is a 70 y.o. male who presents for Medicare Annual/Subsequent preventive examination.  Review of Systems     Cardiac Risk Factors include: advanced age (>57mn, >>41women);dyslipidemia;male gender;obesity (BMI >30kg/m2)     Objective:    Today's Vitals   04/23/21 0943  BP: 130/80  Pulse: 71  Resp: 16  Temp: 98.2 F (36.8 C)  TempSrc: Oral  SpO2: 97%  Weight: 229 lb 4.8 oz (104 kg)  Height: '5\' 10"'$  (1.778 m)   Body mass index is 32.9 kg/m.  Advanced Directives 04/23/2021 04/22/2020 01/18/2019 01/13/2018 05/23/2017 11/30/2016 11/22/2016  Does Patient Have a Medical Advance Directive? No No Yes No;Yes No Yes Yes  Type of Advance Directive - - Healthcare Power of [place] - Living will Living will  Does patient want to make changes to medical advance directive? - - - Yes (MAU/Ambulatory/Procedural Areas - Information given) - No - Patient declined -  [place] of [place]? - - No - copy requested - - - -  Would patient like information on creating a medical advance directive? Yes (MAU/Ambulatory/Procedural Areas - Information given) Yes (MAU/Ambulatory/Procedural Areas - Information given) - - - - -    Current Medications (verified) Outpatient Encounter Medications as of 04/23/2021  Medication Sig   acetaminophen (TYLENOL) 500 MG tablet Take 1 tablet (500 mg total) by mouth every 6 (six) hours as needed. (Patient taking differently: Take 500 mg by mouth 2 (two) times daily.)   Ascorbic Acid (VITAMIN C) 1000 MG tablet Take 1,000 mg by mouth daily.   aspirin EC 81 MG tablet Take 1 tablet (81 mg total) by mouth daily.   atorvastatin (LIPITOR) 40 MG tablet TAKE 1 TABLET(40 MG) BY MOUTH DAILY   MULTIPLE VITAMIN PO Take 1 tablet by mouth daily.   [DISCONTINUED] ibuprofen (ADVIL) 600 MG tablet TK 1 T PO Q 6 H AFTER SURGERY FOR 5 DAYS   No facility-administered encounter medications on file as of 04/23/2021.    Allergies  (verified) Patient has no known allergies.   History: Past Medical History:  Diagnosis Date   Arthritis    KNEE RIGHT   GERD (gastroesophageal reflux disease)    OCC   History of hemorrhoids    HL (hearing loss)    Hyperglycemia    Hyperlipidemia    Left inguinal hernia    Tremors of nervous system    Trigger finger    Past Surgical History:  Procedure Laterality Date   CATARACT EXTRACTION, BILATERAL     COLONOSCOPY  2015   INGUINAL HERNIA REPAIR Left 11/30/2016   Procedure: HERNIA REPAIR INGUINAL ADULT;  Surgeon: SChristene Lye MD;  Location: [facility] ORS;  Service: General;  Laterality: Left;   Family History  Problem Relation Age of Onset   Dementia Mother    Cancer Father 740      Colon   Thyroid disease Sister    Mesothelioma Brother    COPD Brother    Social History   Socioeconomic History   Marital status: Single    Spouse name: Not on file   Number of children: 0   Years of education: Not on file   Highest education level: 12th grade  Occupational History   Occupation: Retired  Tobacco Use   Smoking status: Former    Packs/day: 0.50    Years: 20.00    Pack years: 10.00    Types: Cigarettes    Start date:  08/16/1986    Quit date: 08/16/2006    Years since quitting: 14.6   Smokeless tobacco: Never   Tobacco comments:    smoking cessation materials not required  Vaping Use   Vaping Use: Never used  Substance and Sexual Activity   Alcohol use: No    Alcohol/week: 0.0 standard drinks   Drug use: No   Sexual activity: Not Currently    Partners: Female  Other Topics Concern   Not on file  Social History Narrative   He lives alone, brother died 17-Feb-2018   He was married twice but never had children   He is very close to his sister, but she lives in [place]   He sees his nephew on a regular basis.    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   Has a friend that he eats breakfast with every morning, also plays disc golf a few times a week with  another friend   Social Determinants of Health   Financial Resource Strain: Low Risk    Difficulty of Paying Living Expenses: Not hard at all  Food Insecurity: No Food Insecurity   Worried About Charity fundraiser in the Last Year: Never true   [place] in the Last Year: Never true  Transportation Needs: No Transportation Needs   Lack of Transportation (Medical): No   Lack of Transportation (Non-Medical): No  Physical Activity: Sufficiently Active   Days of Exercise per Week: 3 days   Minutes of Exercise per Session: 60 min  Stress: No Stress Concern Present   Feeling of Stress : Not at all  Social Connections: Socially Isolated   Frequency of Communication with Friends and Family: More than three times a week   Frequency of Social Gatherings with Friends and Family: More than three times a week   Attends Religious Services: Never   Marine scientist or Organizations: No   Attends Music therapist: Never   Marital Status: Divorced    Tobacco Counseling Counseling given: Not Answered Tobacco comments: smoking cessation materials not required   Clinical Intake:  Pre-visit preparation completed: Yes  Pain : No/denies pain     BMI - recorded: 32.9 Nutritional Status: BMI > 30  Obese Nutritional Risks: None Diabetes: No  How often do you need to have someone help you when you read instructions, pamphlets, or other written materials from your doctor or pharmacy?: 1 - Never    Interpreter Needed?: No  Information entered by :: Clemetine Marker LPN   Activities of Daily Living In your present state of health, do you have any difficulty performing the following activities: 04/23/2021 04/15/2021  Hearing? Tempie Donning  Vision? N N  Difficulty concentrating or making decisions? N N  Walking or climbing stairs? N N  Dressing or bathing? N N  Doing errands, shopping? N N  Preparing Food and eating ? N -  Using the Toilet? N -  In the past six months, have  you accidently leaked urine? N -  Do you have problems with loss of bowel control? N -  Managing your Medications? N -  Managing your Finances? N -  Housekeeping or managing your Housekeeping? N -  Some recent data might be hidden    Patient Care Team: Steele Sizer, MD as PCP - General (Family Medicine) Lorelee Cover., MD as Consulting Physician (Ophthalmology)  Indicate any recent Medical Services you may have received from other than [facility] providers in  the past year (date may be approximate).     Assessment:   This is a routine wellness examination for Lee Harvey.  Hearing/Vision screen Hearing Screening - Comments:: Pt c/o moderate hearing difficulty; needs hearing aids but declines at this time  Vision Screening - Comments:: Vision screenings done by Dr. Gloriann Loan  Dietary issues and exercise activities discussed: Current Exercise Habits: Home exercise routine, Type of exercise: walking;Other - see comments (disc golf), Time (Minutes): 60, Frequency (Times/Week): 3, Weekly Exercise (Minutes/Week): 180, Intensity: Moderate, Exercise limited by: None identified   Goals Addressed             This Visit's Progress    DIET - INCREASE WATER INTAKE   On track    Recommend to drink at least 6-8 8oz glasses of water per day.       Depression Screen PHQ 2/9 Scores 04/23/2021 04/15/2021 10/10/2020 08/27/2020 04/22/2020 04/09/2020 07/31/2019  PHQ - 2 Score 0 0 0 1 0 0 0  PHQ- 9 Score - - 0 2 - 0 0    Fall Risk Fall Risk  04/23/2021 04/15/2021 10/10/2020 08/27/2020 04/22/2020  Falls in the past year? 0 0 0 0 0  Number falls in past yr: 0 0 0 0 0  Comment - - - - -  Injury with Fall? 0 0 0 0 0  Risk for fall due to : No Fall Risks No Fall Risks - - No Fall Risks  Risk for fall due to: Comment - - - - -  Follow up Falls prevention discussed Falls prevention discussed - - Falls prevention discussed    FALL RISK PREVENTION PERTAINING TO THE HOME:  Any stairs in or around the home? Yes  If so,  are there any without handrails? No  Home free of loose throw rugs in walkways, pet beds, electrical cords, etc? Yes  Adequate lighting in your home to reduce risk of falls? Yes   ASSISTIVE DEVICES UTILIZED TO PREVENT FALLS:  Life alert? No  Use of a cane, walker or w/c? No  Grab bars in the bathroom? No  Shower chair or bench in shower? No  Elevated toilet seat or a handicapped toilet? Yes   TIMED UP AND GO:  Was the test performed? Yes .  Length of time to ambulate 10 feet: 5 sec.   Gait steady and fast without use of assistive device  Cognitive Function:     6CIT Screen 01/18/2019 01/13/2018  What Year? 0 points 0 points  What month? 0 points 0 points  What time? 0 points 0 points  Count back from 20 0 points 0 points  Months in reverse 0 points 0 points  Repeat phrase 0 points 6 points  Total Score 0 6    Immunizations Immunization History  Administered Date(s) Administered   Fluad Quad(high Dose 65+) 05/21/2019, 04/22/2020, 04/15/2021   Influenza, High Dose Seasonal PF 08/01/2018   Influenza, Seasonal, Injecte, Preservative Fre 08/18/2011   Influenza,inj,Quad PF,6+ Mos 04/11/2014, 04/14/2015, 04/15/2016, 05/23/2017   Influenza-Unspecified 04/11/2014   PFIZER(Purple Top)SARS-COV-2 Vaccination 09/26/2019, 10/17/2019   Pneumococcal Conjugate-13 01/13/2018   Pneumococcal Polysaccharide-23 01/18/2019   Tdap 04/11/2014   Zoster, Live 04/15/2016    TDAP status: Up to date  Flu Vaccine status: Up to date  Pneumococcal vaccine status: Up to date  Covid-19 vaccine status: Completed vaccines  Qualifies for Shingles Vaccine? Yes   Zostavax completed Yes   Shingrix Completed?: No.    Education has been provided regarding the importance of this  vaccine. Patient has been advised to call insurance company to determine out of pocket expense if they have not yet received this vaccine. Advised may also receive vaccine at local pharmacy or Health Dept. Verbalized acceptance and  understanding.  Screening Tests Health Maintenance  Topic Date Due   Zoster Vaccines- Shingrix (1 of 2) Never done   COVID-19 Vaccine (3 - Pfizer risk series) 11/14/2019   Fecal DNA (Cologuard)  03/23/2021   TETANUS/TDAP  04/11/2024   INFLUENZA VACCINE  Completed   Hepatitis C Screening  Completed   PNA vac Low Risk Adult  Completed   HPV VACCINES  Aged Out    Health Maintenance  Health Maintenance Due  Topic Date Due   Zoster Vaccines- Shingrix (1 of 2) Never done   COVID-19 Vaccine (3 - Pfizer risk series) 11/14/2019   Fecal DNA (Cologuard)  03/23/2021    Colorectal cancer screening: Type of screening: Cologuard. Completed 03/23/18. Repeat every 3 years. Pt recently submitted sample; pending results.   Lung Cancer Screening: (Low Dose CT Chest recommended if Age 1-80 years, 30 pack-year currently smoking OR have quit w/in 15years.) does not qualify.   Additional Screening:  Hepatitis C Screening: does qualify; Completed 04/12/13  Vision Screening: Recommended annual ophthalmology exams for early detection of glaucoma and other disorders of the eye. Is the patient up to date with their annual eye exam?  Yes  Who is the provider or what is the name of the office in which the patient attends annual eye exams? Dr. Gloriann Loan.   Dental Screening: Recommended annual dental exams for proper oral hygiene  Community Resource Referral / Chronic Care Management: CRR required this visit?  No   CCM required this visit?  No      Plan:     I have personally reviewed and noted the following in the patient's chart:   Medical and social history Use of alcohol, tobacco or illicit drugs  Current medications and supplements including opioid prescriptions. Patient is not currently taking opioid prescriptions. Functional ability and status Nutritional status Physical activity Advanced directives List of other physicians Hospitalizations, surgeries, and ER visits in previous 12  months Vitals Screenings to include cognitive, depression, and falls Referrals and appointments  In addition, I have reviewed and discussed with patient certain preventive protocols, quality metrics, and best practice recommendations. A written personalized care plan for preventive services as well as general preventive health recommendations were provided to patient.     Clemetine Marker, LPN   [phone]   Nurse Notes: none

## 2021-04-24 LAB — COLOGUARD: Cologuard: NEGATIVE

## 2021-05-28 DIAGNOSIS — L821 Other seborrheic keratosis: Secondary | ICD-10-CM | POA: Diagnosis not present

## 2021-05-28 DIAGNOSIS — Z85828 Personal history of other malignant neoplasm of skin: Secondary | ICD-10-CM | POA: Diagnosis not present

## 2021-05-28 DIAGNOSIS — L570 Actinic keratosis: Secondary | ICD-10-CM | POA: Diagnosis not present

## 2021-05-28 DIAGNOSIS — D225 Melanocytic nevi of trunk: Secondary | ICD-10-CM | POA: Diagnosis not present

## 2021-05-28 DIAGNOSIS — L738 Other specified follicular disorders: Secondary | ICD-10-CM | POA: Diagnosis not present

## 2021-05-28 DIAGNOSIS — L578 Other skin changes due to chronic exposure to nonionizing radiation: Secondary | ICD-10-CM | POA: Diagnosis not present

## 2021-08-06 ENCOUNTER — Other Ambulatory Visit: Payer: Self-pay | Admitting: Family Medicine

## 2021-08-06 DIAGNOSIS — I700 Atherosclerosis of aorta: Secondary | ICD-10-CM

## 2021-08-06 DIAGNOSIS — E7800 Pure hypercholesterolemia, unspecified: Secondary | ICD-10-CM

## 2021-08-06 NOTE — Telephone Encounter (Signed)
Requested medication (s) are on the active medication list: yes  Notes to clinic:  no signature/instructions on rx, please assess.   Requested Prescriptions  Pending Prescriptions Disp Refills   atorvastatin (LIPITOR) 40 MG tablet 90 tablet 0     Cardiovascular:  Antilipid - Statins Passed - 08/06/2021  5:28 PM      Passed - Total Cholesterol in normal range and within 360 days    Cholesterol, Total  Date Value Ref Range Status  04/14/2015 148 100 - 199 mg/dL Final   Cholesterol  Date Value Ref Range Status  04/15/2021 124 <200 mg/dL Final          Passed - LDL in normal range and within 360 days    LDL Cholesterol (Calc)  Date Value Ref Range Status  04/15/2021 57 mg/dL (calc) Final    Comment:    Reference range: <100 . Desirable range <100 mg/dL for primary prevention;   <70 mg/dL for patients with CHD or diabetic patients  with > or = 2 CHD risk factors. Marland Kitchen LDL-C is now calculated using the Martin-Hopkins  calculation, which is a validated novel method providing  better accuracy than the Friedewald equation in the  estimation of LDL-C.  Cresenciano Genre et al. Annamaria Helling. 1610;960(45): 2061-2068  (http://education.[facility].com/faq/FAQ164)           Passed - HDL in normal range and within 360 days    HDL  Date Value Ref Range Status  04/15/2021 50 > OR = 40 mg/dL Final  04/14/2015 57 >39 mg/dL Final    Comment:    According to ATP-III Guidelines, HDL-C >59 mg/dL is considered a negative risk factor for CHD.           Passed - Triglycerides in normal range and within 360 days    Triglycerides  Date Value Ref Range Status  04/15/2021 85 <150 mg/dL Final          Passed - Patient is not pregnant      Passed - Valid encounter within last 12 months    Recent Outpatient Visits           3 months ago Dilation of aorta [facility])   [facility] Steele Sizer, MD   10 months ago Senile purpura [facility])   [facility] Steele Sizer, MD   11 months ago Encounter for [employer] annual wellness exam   [facility] Steele Sizer, MD   1 year ago Atherosclerosis of aorta [facility])   [facility] Sizer, MD   2 years ago Atherosclerosis of aorta [facility])   [facility] Steele Sizer, MD       Future Appointments             In 1 month Steele Sizer, MD [facility], [place]   In 2 months Steele Sizer, MD [facility], [place]   In 8 months  [facility], [facility]

## 2021-08-06 NOTE — Telephone Encounter (Signed)
Medication Refill - Medication: Atorvastatin 40 mg  Has the patient contacted their pharmacy? Yes.  They told him to call the office (Agent: If no, request that the patient contact the pharmacy for the refill. If patient does not wish to contact the pharmacy document the reason why and proceed with request.) (Agent: If yes, when and what did the pharmacy advise?)  Preferred Pharmacy (with phone number or street name): [facility] Has the patient been seen for an appointment in the last year OR does the patient have an upcoming appointment? Yes.    Agent: Please be advised that RX refills may take up to 3 business days. We ask that you follow-up with your pharmacy.

## 2021-08-07 MED ORDER — ATORVASTATIN CALCIUM 40 MG PO TABS
40.0000 mg | ORAL_TABLET | Freq: Every day | ORAL | 0 refills | Status: DC
Start: 2021-08-07 — End: 2021-10-13

## 2021-09-07 NOTE — Progress Notes (Signed)
Name: Lee Harvey   MRN: [id number]    DOB: [birth year]   Date:09/08/2021       Progress Note  Subjective  Chief Complaint  Annual Exam  HPI  Patient presents for annual CPE .  IPSS Questionnaire (AUA-7): Over the past month   1)  How often have you had a sensation of not emptying your bladder completely after you finish urinating?  0 - Not at all  2)  How often have you had to urinate again less than two hours after you finished urinating? 0 - Not at all  3)  How often have you found you stopped and started again several times when you urinated?  0 - Not at all  4) How difficult have you found it to postpone urination?  0 - Not at all  5) How often have you had a weak urinary stream?  0 - Not at all  6) How often have you had to push or strain to begin urination?  0 - Not at all  7) How many times did you most typically get up to urinate from the time you went to bed until the time you got up in the morning?  1 - 1 time  Total score:  0-7 mildly symptomatic   8-19 moderately symptomatic   20-35 severely symptomatic     Diet: lives alone and eats out most of the time. Biscuits for breakfast, eats a sandwich for lunch , eats out for dinner- [employer] and local restaurants. Discussed risk of increase in calories and also salt when eating out.  Exercise: discussed 150 minutes per week.   Depression: phq 9 is negative Depression screen [facility] 2/9 09/08/2021 04/23/2021 04/15/2021 10/10/2020 08/27/2020  Decreased Interest 0 0 0 0 1  Down, Depressed, Hopeless 0 0 0 0 0  PHQ - 2 Score 0 0 0 0 1  Altered sleeping 0 - - 0 1  Tired, decreased energy 0 - - 0 0  Change in appetite 0 - - 0 0  Feeling bad or failure about yourself  0 - - 0 0  Trouble concentrating 0 - - 0 0  Moving slowly or fidgety/restless 0 - - 0 0  Suicidal thoughts 0 - - 0 0  PHQ-9 Score 0 - - 0 2  Difficult doing work/chores - - - - Not difficult at all  Some recent data might be hidden    Hypertension:  BP Readings from Last  3 Encounters:  09/08/21 140/82  04/23/21 130/80  04/15/21 130/84    Obesity: Wt Readings from Last 3 Encounters:  09/08/21 232 lb (105.2 kg)  04/23/21 229 lb 4.8 oz (104 kg)  04/15/21 230 lb (104.3 kg)   BMI Readings from Last 3 Encounters:  09/08/21 33.29 kg/m  04/23/21 32.90 kg/m  04/15/21 33.00 kg/m     Lipids:  Lab Results  Component Value Date   CHOL 124 04/15/2021   CHOL 137 04/09/2020   CHOL 137 02/08/2019   Lab Results  Component Value Date   HDL 50 04/15/2021   HDL 50 04/09/2020   HDL 48 02/08/2019   Lab Results  Component Value Date   LDLCALC 57 04/15/2021   LDLCALC 69 04/09/2020   LDLCALC 69 02/08/2019   Lab Results  Component Value Date   TRIG 85 04/15/2021   TRIG 97 04/09/2020   TRIG 116 02/08/2019   Lab Results  Component Value Date   CHOLHDL 2.5 04/15/2021   CHOLHDL 2.7 04/09/2020  CHOLHDL 2.9 02/08/2019   No results found for: LDLDIRECT Glucose:  Glucose, Bld  Date Value Ref Range Status  04/15/2021 96 65 - 99 mg/dL Final    Comment:    .            Fasting reference interval .   04/09/2020 108 (H) 65 - 99 mg/dL Final    Comment:    .            Fasting reference interval . For someone without known diabetes, a glucose value between 100 and 125 mg/dL is consistent with prediabetes and should be confirmed with a follow-up test. .   02/08/2019 101 (H) 65 - 99 mg/dL Final    Comment:    .            Fasting reference interval . For someone without known diabetes, a glucose value between 100 and 125 mg/dL is consistent with prediabetes and should be confirmed with a follow-up test. .     Flowsheet Row Clinical Support from 04/23/2021 in [facility]  AUDIT-C Score 0       Single STD testing and prevention (HIV/chl/gon/syphilis): Not interested at this time Hep C: 04/12/13  Skin cancer: Discussed monitoring for atypical lesions Colorectal cancer: 04/21/21  Prostate cancer:  Lab Results   Component Value Date   PSA 0.94 08/27/2020   PSA 0.9 05/23/2017   PSA 0.9 04/15/2016     Lung cancer: Low Dose CT Chest recommended if Age 29-80 years, 30 pack-year currently smoking OR have quit w/in 15years. Patient does not qualify.   AAA: repeat in 2024  ECG: 10/20/16  Vaccines:   Shingrix: He states his insurance told him to go to [employer]  Pneumonia: educated and discussed with patient. Flu: up to date  COVID-19: up to date   Advanced Care Planning: A voluntary discussion about advance care planning including the explanation and discussion of advance directives.  Discussed health care proxy and Living will, and the patient was able to identify a health care proxy as nephew - Lee Harvey .  He will update his will   Patient Active Problem List   Diagnosis Date Noted   Senile purpura ([place]) 08/27/2020   Dilation of aorta ([place]) 08/06/2018   Atherosclerosis of aorta ([place]) 08/06/2018   Right knee pain 10/20/2016   Hyperlipidemia 04/22/2016   Hyperglycemia 04/15/2016   Difficulty hearing 04/13/2015   Inguinal hernia 04/13/2015   Tremor of both hands 04/13/2015   Excess weight 04/13/2015    Past Surgical History:  Procedure Laterality Date   CATARACT EXTRACTION, BILATERAL     COLONOSCOPY  2015   INGUINAL HERNIA REPAIR Left 11/30/2016   Procedure: HERNIA REPAIR INGUINAL ADULT;  Surgeon: Lee Lye, MD;  Location: [facility] ORS;  Service: General;  Laterality: Left;    Family History  Problem Relation Age of Onset   Dementia Mother    Cancer Father 33       Colon   Thyroid disease Sister    Mesothelioma Brother    COPD Brother     Social History   Socioeconomic History   Marital status: Single    Spouse name: Not on file   Number of children: 0   Years of education: Not on file   Highest education level: 12th grade  Occupational History   Occupation: Retired  Tobacco Use   Smoking status: Former    Packs/day: 0.50    Years: 20.00    Pack  years:  10.00    Types: Cigarettes    Start date: 08/16/1986    Quit date: 08/16/2006    Years since quitting: 15.0   Smokeless tobacco: Never   Tobacco comments:    smoking cessation materials not required  Vaping Use   Vaping Use: Never used  Substance and Sexual Activity   Alcohol use: No    Alcohol/week: 0.0 standard drinks   Drug use: No   Sexual activity: Not Currently    Partners: Female  Other Topics Concern   Not on file  Social History Narrative   He lives alone, brother died 02/09/2018   He was married twice but never had children   He is very close to his sister, but she lives in [place]   He sees his nephew on a regular basis.    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   Has a friend that he eats breakfast with every morning, also plays disc golf a few times a week with another friend   Social Determinants of Health   Financial Resource Strain: Low Risk    Difficulty of Paying Living Expenses: Not hard at all  Food Insecurity: No Food Insecurity   Worried About Charity fundraiser in the Last Year: Never true   [place] in the Last Year: Never true  Transportation Needs: No Transportation Needs   Lack of Transportation (Medical): No   Lack of Transportation (Non-Medical): No  Physical Activity: Insufficiently Active   Days of Exercise per Week: 2 days   Minutes of Exercise per Session: 50 min  Stress: No Stress Concern Present   Feeling of Stress : Only a little  Social Connections: Socially Isolated   Frequency of Communication with Friends and Family: More than three times a week   Frequency of Social Gatherings with Friends and Family: More than three times a week   Attends Religious Services: Never   Marine scientist or Organizations: No   Attends Music therapist: Never   Marital Status: Divorced  Human resources officer Violence: Not At Risk   Fear of Current or Ex-Partner: No   Emotionally Abused: No   Physically Abused: No   Sexually  Abused: No     Current Outpatient Medications:    acetaminophen (TYLENOL) 500 MG tablet, Take 1 tablet (500 mg total) by mouth every 6 (six) hours as needed. (Patient taking differently: Take 500 mg by mouth 2 (two) times daily.), Disp: 30 tablet, Rfl: 0   Ascorbic Acid (VITAMIN C) 1000 MG tablet, Take 1,000 mg by mouth daily., Disp: , Rfl:    aspirin EC 81 MG tablet, Take 1 tablet (81 mg total) by mouth daily., Disp: 30 tablet, Rfl: 0   atorvastatin (LIPITOR) 40 MG tablet, Take 1 tablet (40 mg total) by mouth daily., Disp: 90 tablet, Rfl: 0   MULTIPLE VITAMIN PO, Take 1 tablet by mouth daily., Disp: , Rfl:   No Known Allergies   ROS  Constitutional: Negative for fever or weight change.  Respiratory: Negative for cough and shortness of breath.   Cardiovascular: Negative for chest pain or palpitations.  Gastrointestinal: Negative for abdominal pain, no bowel changes.  Musculoskeletal: Negative for gait problem or joint swelling.  Skin: Negative for rash.  Neurological: Negative for dizziness or headache.  No other specific complaints in a complete review of systems (except as listed in HPI above).    Objective  Vitals:   09/08/21 2725  BP: 140/82  Pulse: 77  Resp: 16  Temp: 98.1 F (36.7 C)  SpO2: 96%  Weight: 232 lb (105.2 kg)  Height: 5\' 10"  (1.778 m)    Body mass index is 33.29 kg/m.  Physical Exam  Constitutional: Patient appears well-developed and well-nourished. No distress.  HENT: Head: Normocephalic and atraumatic. Ears: B TMs ok, no erythema or effusion; Nose: Not done. Mouth/Throat: not done  Eyes: Conjunctivae and EOM are normal. Pupils are equal, round, and reactive to light. No scleral icterus.  Neck: Normal range of motion. Neck supple. No JVD present. No thyromegaly present.  Cardiovascular: Normal rate, regular rhythm and normal heart sounds.  No murmur heard. No BLE edema. Pulmonary/Chest: Effort normal and breath sounds normal. No respiratory  distress. Abdominal: Soft. Bowel sounds are normal, no distension. There is no tenderness. no masses MALE GENITALIA: Normal descended testes bilaterally, no masses palpated, no hernias, no lesions, no discharge RECTAL:not done  Musculoskeletal: Normal range of motion, no joint effusions. No gross deformities Neurological: he is alert and oriented to person, place, and time. No cranial nerve deficit. Coordination, balance, strength, speech and gait are normal.  Skin: Skin is warm and dry. No rash noted. Senile purpura on arms Psychiatric: Patient has a normal mood and affect. behavior is normal. Judgment and thought content normal.   Fall Risk: Fall Risk  09/08/2021 04/23/2021 04/15/2021 10/10/2020 08/27/2020  Falls in the past year? 0 0 0 0 0  Number falls in past yr: 0 0 0 0 0  Comment - - - - -  Injury with Fall? 0 0 0 0 0  Risk for fall due to : No Fall Risks No Fall Risks No Fall Risks - -  Risk for fall due to: Comment - - - - -  Follow up Falls prevention discussed Falls prevention discussed Falls prevention discussed - -      Functional Status Survey: Is the patient deaf or have difficulty hearing?: No Does the patient have difficulty seeing, even when wearing glasses/contacts?: No Does the patient have difficulty concentrating, remembering, or making decisions?: No Does the patient have difficulty walking or climbing stairs?: No Does the patient have difficulty dressing or bathing?: No Does the patient have difficulty doing errands alone such as visiting a doctor's office or shopping?: No    Assessment & Plan  1. Well adult exam   -Prostate cancer screening and PSA options (with potential risks and benefits of testing vs not testing) were discussed along with recent recs/guidelines. -USPSTF grade A and B recommendations reviewed with patient; age-appropriate recommendations, preventive care, screening tests, etc discussed and encouraged; healthy living encouraged; see AVS for  patient education given to patient -Discussed importance of 150 minutes of physical activity weekly, eat two servings of fish weekly, eat one serving of tree nuts ( cashews, pistachios, pecans, almonds.Marland Kitchen) every other day, eat 6 servings of fruit/vegetables daily and drink plenty of water and avoid sweet beverages.

## 2021-09-08 ENCOUNTER — Ambulatory Visit (INDEPENDENT_AMBULATORY_CARE_PROVIDER_SITE_OTHER): Payer: PPO | Admitting: Family Medicine

## 2021-09-08 ENCOUNTER — Encounter: Payer: Self-pay | Admitting: Family Medicine

## 2021-09-08 VITALS — BP 140/82 | HR 77 | Temp 98.1°F | Resp 16 | Ht 70.0 in | Wt 232.0 lb

## 2021-09-08 DIAGNOSIS — Z0000 Encounter for general adult medical examination without abnormal findings: Secondary | ICD-10-CM

## 2021-09-08 NOTE — Patient Instructions (Signed)
Food Choices for Gastroesophageal Reflux Disease, Adult When you have gastroesophageal reflux disease (GERD), the foods you eat and your eating habits are very important. Choosing the right foods can help ease your discomfort. Think about working with a food expert (dietitian) to help you make good choices. What are tips for following this plan? Reading food labels Look for foods that are low in saturated fat. Foods that may help with your symptoms include: Foods that have less than 5% of daily value (DV) of fat. Foods that have 0 grams of trans fat. Cooking Do not fry your food. Cook your food by baking, steaming, grilling, or broiling. These are all methods that do not need a lot of fat for cooking. To add flavor, try to use herbs that are low in spice and acidity. Meal planning  Choose healthy foods that are low in fat, such as: Fruits and vegetables. Whole grains. Low-fat dairy products. Lean meats, fish, and poultry. Eat small meals often instead of eating 3 large meals each day. Eat your meals slowly in a place where you are relaxed. Avoid bending over or lying down until 2-3 hours after eating. Limit high-fat foods such as fatty meats or fried foods. Limit your intake of fatty foods, such as oils, butter, and shortening. Avoid the following as told by your doctor: Foods that cause symptoms. These may be different for different people. Keep a food diary to keep track of foods that cause symptoms. Alcohol. Drinking a lot of liquid with meals. Eating meals during the 2-3 hours before bed. Lifestyle Stay at a healthy weight. Ask your doctor what weight is healthy for you. If you need to lose weight, work with your doctor to do so safely. Exercise for at least 30 minutes on 5 or more days each week, or as told by your doctor. Wear loose-fitting clothes. Do not smoke or use any products that contain nicotine or tobacco. If you need help quitting, ask your doctor. Sleep with the head  of your bed higher than your feet. Use a wedge under the mattress or blocks under the bed frame to raise the head of the bed. Chew sugar-free gum after meals. What foods should eat? Eat a healthy, well-balanced diet of fruits, vegetables, whole grains, low-fat dairy products, lean meats, fish, and poultry. Each person is different. Foods that may cause symptoms in one person may not cause any symptoms in another person. Work with your doctor to find foods that are safe for you. The items listed above may not be a complete list of what you can eat and drink. Contact a food expert for more options. What foods should I avoid? Limiting some of these foods may help in managing the symptoms of GERD. Everyone is different. Talk with a food expert or your doctor to help you find the exact foods to avoid, if any. Fruits Any fruits prepared with added fat. Any fruits that cause symptoms. For some people, this may include citrus fruits, such as oranges, grapefruit, pineapple, and lemons. Vegetables Deep-fried vegetables. French fries. Any vegetables prepared with added fat. Any vegetables that cause symptoms. For some people, this may include tomatoes and tomato products, chili peppers, onions and garlic, and horseradish. Grains Pastries or quick breads with added fat. Meats and other proteins High-fat meats, such as fatty beef or pork, hot dogs, ribs, ham, sausage, salami, and bacon. Fried meat or protein, including fried fish and fried chicken. Nuts and nut butters, in large amounts. Dairy Whole milk   and chocolate milk. Sour cream. Cream. Ice cream. Cream cheese. Milkshakes. Fats and oils Butter. Margarine. Shortening. Ghee. Beverages Coffee and tea, with or without caffeine. Carbonated beverages. Sodas. Energy drinks. Fruit juice made with acidic fruits, such as orange or grapefruit. Tomato juice. Alcoholic drinks. Sweets and desserts Chocolate and cocoa. Donuts. Seasonings and condiments Pepper.  Peppermint and spearmint. Added salt. Any condiments, herbs, or seasonings that cause symptoms. For some people, this may include curry, hot sauce, or vinegar-based salad dressings. The items listed above may not be a complete list of what you should not eat and drink. Contact a food expert for more options. Questions to ask your doctor Diet and lifestyle changes are often the first steps that are taken to manage symptoms of GERD. If diet and lifestyle changes do not help, talk with your doctor about taking medicines. Where to find more information International Foundation for Gastrointestinal Disorders: aboutgerd.org Summary When you have GERD, food and lifestyle choices are very important in easing your symptoms. Eat small meals often instead of 3 large meals a day. Eat your meals slowly and in a place where you are relaxed. Avoid bending over or lying down until 2-3 hours after eating. Limit high-fat foods such as fatty meats or fried foods. This information is not intended to replace advice given to you by your health care provider. Make sure you discuss any questions you have with your health care provider. Document Revised: 02/11/2020 Document Reviewed: 02/11/2020 Elsevier Patient Education  [place] 65 Years and Older, Male Preventive care refers to lifestyle choices and visits with your health care provider that can promote health and wellness. Preventive care visits are also called wellness exams. What can I expect for my preventive care visit? Counseling During your preventive care visit, your health care provider may ask about your: Medical history, including: Past medical problems. Family medical history. History of falls. Current health, including: Emotional well-being. Home life and relationship well-being. Sexual activity. Memory and ability to understand (cognition). Lifestyle, including: Alcohol, nicotine or tobacco, and drug use. Access to  firearms. Diet, exercise, and sleep habits. Work and work Statistician. Sunscreen use. Safety issues such as seatbelt and bike helmet use. Physical exam Your health care provider will check your: Height and weight. These may be used to calculate your BMI (body mass index). BMI is a measurement that tells if you are at a healthy weight. Waist circumference. This measures the distance around your waistline. This measurement also tells if you are at a healthy weight and may help predict your risk of certain diseases, such as type 2 diabetes and high blood pressure. Heart rate and blood pressure. Body temperature. Skin for abnormal spots. What immunizations do I need? Vaccines are usually given at various ages, according to a schedule. Your health care provider will recommend vaccines for you based on your age, medical history, and lifestyle or other factors, such as travel or where you work. What tests do I need? Screening Your health care provider may recommend screening tests for certain conditions. This may include: Lipid and cholesterol levels. Diabetes screening. This is done by checking your blood sugar (glucose) after you have not eaten for a while (fasting). Hepatitis C test. Hepatitis B test. HIV (human immunodeficiency virus) test. STI (sexually transmitted infection) testing, if you are at risk. Lung cancer screening. Colorectal cancer screening. Prostate cancer screening. Abdominal aortic aneurysm (AAA) screening. You may need this if you are a current or former smoker. Talk with  your health care provider about your test results, treatment options, and if necessary, the need for more tests. Follow these instructions at home: Eating and drinking  Eat a diet that includes fresh fruits and vegetables, whole grains, lean protein, and low-fat dairy products. Limit your intake of foods with high amounts of sugar, saturated fats, and salt. Take vitamin and mineral supplements as  recommended by your health care provider. Do not drink alcohol if your health care provider tells you not to drink. If you drink alcohol: Limit how much you have to 0-2 drinks a day. Know how much alcohol is in your drink. In the [place] one drink equals one 12 oz bottle of beer (355 mL), one 5 oz glass of wine (148 mL), or one 1 oz glass of hard liquor (44 mL). Lifestyle Brush your teeth every morning and night with fluoride toothpaste. Floss one time each day. Exercise for at least 30 minutes 5 or more days each week. Do not use any products that contain nicotine or tobacco. These products include cigarettes, chewing tobacco, and vaping devices, such as e-cigarettes. If you need help quitting, ask your health care provider. Do not use drugs. If you are sexually active, practice safe sex. Use a condom or other form of protection to prevent STIs. Take aspirin only as told by your health care provider. Make sure that you understand how much to take and what form to take. Work with your health care provider to find out whether it is safe and beneficial for you to take aspirin daily. Ask your health care provider if you need to take a cholesterol-lowering medicine (statin). Find healthy ways to manage stress, such as: Meditation, yoga, or listening to music. Journaling. Talking to a trusted person. Spending time with friends and family. Safety Always wear your seat belt while driving or riding in a vehicle. Do not drive: If you have been drinking alcohol. Do not ride with someone who has been drinking. When you are tired or distracted. While texting. If you have been using any mind-altering substances or drugs. Wear a helmet and other protective equipment during sports activities. If you have firearms in your house, make sure you follow all gun safety procedures. Minimize exposure to UV radiation to reduce your risk of skin cancer. What's next? Visit your health care provider once a year for  an annual wellness visit. Ask your health care provider how often you should have your eyes and teeth checked. Stay up to date on all vaccines. This information is not intended to replace advice given to you by your health care provider. Make sure you discuss any questions you have with your health care provider. Document Revised: 01/28/2021 Document Reviewed: 01/28/2021 Elsevier Patient Education  [place].

## 2021-10-12 NOTE — Progress Notes (Signed)
Name: Lee Harvey   MRN: [id number]    DOB: [birth year]   Date:10/13/2021       Progress Note  Subjective  Chief Complaint  Follow Up  HPI  OA right knee: he only takes Tylenol occasionally. Pain is described as aching, sometimes has effusion but no redness .He has been playing disc golf a few times a week.    Pre-diabetes :  hgbA1C was 5.9% but last time it was down to 5.8 %  no polyphagia, polydipsia or polyuria. He is eating healthier,he avoids sweets, he is back playing disc golf again. He is still drinking sweet tea about once a day   Hyperlipidemia: taking Atorvastatin and denies side effects of medication, last labs showed LDL at goal at 57. No myalgia  Doing well    Obesity: weight is stable now,  he cooks at home, he gained a few pounds since last week. Discussed portion control    Tremors : both hands and stable, worse when nervous, left side worse than right  Not constant and stable. He states his aunt had tremors He denies balance problems or memory changes, or falls. I placed a referral last Summer for him to see a neurologist but he decided not to go .      Atherosclerosis of Aorta and Aorta ectasy: repeat US in 2024, he is on statin therapy , last LDL at goal down to 57 Denies myopahty   Senile purpura: usually on arms. Stable and reassurance given   Left thumb pain: he has noticed pain on left thumb worse when playing golf - he is not interested in seeing Ortho , tylenol is safe  Patient Active Problem List   Diagnosis Date Noted   Senile purpura ([place]) 08/27/2020   Dilation of aorta ([place]) 08/06/2018   Atherosclerosis of aorta ([place]) 08/06/2018   Right knee pain 10/20/2016   Hyperlipidemia 04/22/2016   Hyperglycemia 04/15/2016   Difficulty hearing 04/13/2015   Inguinal hernia 04/13/2015   Tremor of both hands 04/13/2015   Excess weight 04/13/2015    Past Surgical History:  Procedure Laterality Date   CATARACT EXTRACTION, BILATERAL     COLONOSCOPY  2015    INGUINAL HERNIA REPAIR Left 11/30/2016   Procedure: HERNIA REPAIR INGUINAL ADULT;  Surgeon: Christene Lye, MD;  Location: [facility] ORS;  Service: General;  Laterality: Left;    Family History  Problem Relation Age of Onset   Dementia Mother    Cancer Father 61       Colon   Thyroid disease Sister    Mesothelioma Brother    COPD Brother     Social History   Tobacco Use   Smoking status: Former    Packs/day: 0.50    Years: 20.00    Pack years: 10.00    Types: Cigarettes    Start date: 08/16/1986    Quit date: 08/16/2006    Years since quitting: 15.1   Smokeless tobacco: Never   Tobacco comments:    smoking cessation materials not required  Substance Use Topics   Alcohol use: No    Alcohol/week: 0.0 standard drinks     Current Outpatient Medications:    acetaminophen (TYLENOL) 500 MG tablet, Take 1 tablet (500 mg total) by mouth every 6 (six) hours as needed. (Patient taking differently: Take 500 mg by mouth 2 (two) times daily.), Disp: 30 tablet, Rfl: 0   Ascorbic Acid (VITAMIN C) 1000 MG tablet, Take 1,000 mg by mouth daily., Disp: , Rfl:  aspirin EC 81 MG tablet, Take 1 tablet (81 mg total) by mouth daily., Disp: 30 tablet, Rfl: 0   atorvastatin (LIPITOR) 40 MG tablet, Take 1 tablet (40 mg total) by mouth daily., Disp: 90 tablet, Rfl: 0   MULTIPLE VITAMIN PO, Take 1 tablet by mouth daily., Disp: , Rfl:   No Known Allergies  I personally reviewed active problem list, medication list, allergies, family history, social history, health maintenance with the patient/caregiver today.   ROS  Constitutional: Negative for fever or weight change.  Respiratory: Negative for cough and shortness of breath.   Cardiovascular: Negative for chest pain or palpitations.  Gastrointestinal: Negative for abdominal pain, no bowel changes.  Musculoskeletal: Negative for gait problem or joint swelling.  Skin: Negative for rash.  Neurologist: swollen and tender left thumb joint   Neurological: Negative for dizziness or headache.  No other specific complaints in a complete review of systems (except as listed in HPI above).   Objective  Vitals:   10/13/21 0737  BP: 140/70  Pulse: 72  Resp: 16  SpO2: 96%  Weight: 235 lb (106.6 kg)  Height: 5\' 10"  (1.778 m)    Body mass index is 33.72 kg/m.  Physical Exam  Constitutional: Patient appears well-developed and well-nourished. Obese  No distress.  HEENT: head atraumatic, normocephalic, pupils equal and reactive to light, neck supple Cardiovascular: Normal rate, regular rhythm and normal heart sounds.  No murmur heard. No BLE edema. Pulmonary/Chest: Effort normal and breath sounds normal. No respiratory distress. Abdominal: Soft.  There is no tenderness. Psychiatric: Patient has a normal mood and affect. behavior is normal. Judgment and thought content normal.   PHQ2/9: Depression screen [facility] 2/9 10/13/2021 09/08/2021 04/23/2021 04/15/2021 10/10/2020  Decreased Interest 0 0 0 0 0  Down, Depressed, Hopeless 0 0 0 0 0  PHQ - 2 Score 0 0 0 0 0  Altered sleeping 0 0 - - 0  Tired, decreased energy 0 0 - - 0  Change in appetite 0 0 - - 0  Feeling bad or failure about yourself  0 0 - - 0  Trouble concentrating 0 0 - - 0  Moving slowly or fidgety/restless 0 0 - - 0  Suicidal thoughts 0 0 - - 0  PHQ-9 Score 0 0 - - 0  Difficult doing work/chores - - - - -  Some recent data might be hidden    phq 9 is negative  Fall Risk: Fall Risk  10/13/2021 09/08/2021 04/23/2021 04/15/2021 10/10/2020  Falls in the past year? 0 0 0 0 0  Number falls in past yr: 0 0 0 0 0  Comment - - - - -  Injury with Fall? 0 0 0 0 0  Risk for fall due to : No Fall Risks No Fall Risks No Fall Risks No Fall Risks -  Risk for fall due to: Comment - - - - -  Follow up Falls prevention discussed Falls prevention discussed Falls prevention discussed Falls prevention discussed -      Functional Status Survey: Is the patient deaf or have difficulty  hearing?: Yes Does the patient have difficulty seeing, even when wearing glasses/contacts?: No Does the patient have difficulty concentrating, remembering, or making decisions?: No Does the patient have difficulty walking or climbing stairs?: No Does the patient have difficulty dressing or bathing?: No Does the patient have difficulty doing errands alone such as visiting a doctor's office or shopping?: No    Assessment & Plan  1. Senile purpura (  [place])   2. Dilation of aorta ([facility])   3. Atherosclerosis of aorta ([facility])  - atorvastatin (LIPITOR) 40 MG tablet; Take 1 tablet (40 mg total) by mouth daily.  Dispense: 90 tablet; Refill: 3  4. Pre-diabetes   5. Primary osteoarthritis of right knee   6. Pure hypercholesterolemia  - atorvastatin (LIPITOR) 40 MG tablet; Take 1 tablet (40 mg total) by mouth daily.  Dispense: 90 tablet; Refill: 3  7. Hyperglycemia   8. Borderline blood pressure

## 2021-10-13 ENCOUNTER — Ambulatory Visit (INDEPENDENT_AMBULATORY_CARE_PROVIDER_SITE_OTHER): Payer: PPO | Admitting: Family Medicine

## 2021-10-13 ENCOUNTER — Other Ambulatory Visit: Payer: Self-pay

## 2021-10-13 ENCOUNTER — Encounter: Payer: Self-pay | Admitting: Family Medicine

## 2021-10-13 VITALS — BP 140/70 | HR 72 | Resp 16 | Ht 70.0 in | Wt 235.0 lb

## 2021-10-13 DIAGNOSIS — M1711 Unilateral primary osteoarthritis, right knee: Secondary | ICD-10-CM

## 2021-10-13 DIAGNOSIS — I700 Atherosclerosis of aorta: Secondary | ICD-10-CM | POA: Diagnosis not present

## 2021-10-13 DIAGNOSIS — R739 Hyperglycemia, unspecified: Secondary | ICD-10-CM

## 2021-10-13 DIAGNOSIS — D692 Other nonthrombocytopenic purpura: Secondary | ICD-10-CM | POA: Diagnosis not present

## 2021-10-13 DIAGNOSIS — I77819 Aortic ectasia, unspecified site: Secondary | ICD-10-CM

## 2021-10-13 DIAGNOSIS — R7303 Prediabetes: Secondary | ICD-10-CM

## 2021-10-13 DIAGNOSIS — E7800 Pure hypercholesterolemia, unspecified: Secondary | ICD-10-CM | POA: Diagnosis not present

## 2021-10-13 DIAGNOSIS — R030 Elevated blood-pressure reading, without diagnosis of hypertension: Secondary | ICD-10-CM

## 2021-10-13 MED ORDER — ATORVASTATIN CALCIUM 40 MG PO TABS: 40.0000 mg | ORAL_TABLET | Freq: Every day | ORAL | 3 refills | Status: DC

## 2021-11-07 ENCOUNTER — Other Ambulatory Visit: Payer: Self-pay | Admitting: Family Medicine

## 2021-11-07 DIAGNOSIS — E7800 Pure hypercholesterolemia, unspecified: Secondary | ICD-10-CM

## 2021-11-07 DIAGNOSIS — I700 Atherosclerosis of aorta: Secondary | ICD-10-CM

## 2021-11-26 DIAGNOSIS — L578 Other skin changes due to chronic exposure to nonionizing radiation: Secondary | ICD-10-CM | POA: Diagnosis not present

## 2021-11-26 DIAGNOSIS — D225 Melanocytic nevi of trunk: Secondary | ICD-10-CM | POA: Diagnosis not present

## 2021-11-26 DIAGNOSIS — L821 Other seborrheic keratosis: Secondary | ICD-10-CM | POA: Diagnosis not present

## 2021-11-26 DIAGNOSIS — D485 Neoplasm of uncertain behavior of skin: Secondary | ICD-10-CM | POA: Diagnosis not present

## 2021-11-26 DIAGNOSIS — D224 Melanocytic nevi of scalp and neck: Secondary | ICD-10-CM | POA: Diagnosis not present

## 2021-11-26 DIAGNOSIS — Z85828 Personal history of other malignant neoplasm of skin: Secondary | ICD-10-CM | POA: Diagnosis not present

## 2021-12-22 DIAGNOSIS — L988 Other specified disorders of the skin and subcutaneous tissue: Secondary | ICD-10-CM | POA: Diagnosis not present

## 2021-12-22 DIAGNOSIS — D234 Other benign neoplasm of skin of scalp and neck: Secondary | ICD-10-CM | POA: Diagnosis not present

## 2022-04-09 NOTE — Progress Notes (Unsigned)
Name: Lee Harvey   MRN: [id number]    DOB: 04-04-1951   Date:04/12/2022       Progress Note  Subjective  Chief Complaint  Follow Up  HPI  OA right knee: he only takes Tylenol occasionally. Pain is described as aching, sometimes has effusion but no redness .He has been playing disc golf  but less frequently lately due to the heat    Pre-diabetes : polyphagia, polydipsia or polyuria. He gained weight , states drinking sodas again, but trying to cut down on drinking sweet beverages.    Hyperlipidemia: taking Atorvastatin and denies side effects of medication, last labs showed LDL at goal at 57. No myalgia  We will recheck labs    Obesity: weight is trending , he cooks at home, he needs to cut down on sodas again    Tremors : both hands and stable, worse when nervous, left side worse than right  Not constant and stable. He states his aunt had tremors He denies balance problems or memory changes, or falls. I placed a referral last Summer for him to see a neurologist but he decided not to go, he states tremors worse when he gets aggravated. Mar[place] Kitchen He would like to try medication, discussed low dose atenolol at night      Atherosclerosis of Aorta and Aorta ectasy: repeat US in Dec  2024, he is on statin therapy , last LDL at goal down to 75 Denies myopahty and is due for lipid panel again   Senile purpura: usually on arms.  Reassurane given    Patient Active Problem List   Diagnosis Date Noted   Senile purpura ([place]) 08/27/2020   Dilation of aorta ([place]) 08/06/2018   Atherosclerosis of aorta ([place]) 08/06/2018   Right knee pain 10/20/2016   Hyperlipidemia 04/22/2016   Hyperglycemia 04/15/2016   Difficulty hearing 04/13/2015   Inguinal hernia 04/13/2015   Tremor of both hands 04/13/2015   Excess weight 04/13/2015    Past Surgical History:  Procedure Laterality Date   CATARACT EXTRACTION, BILATERAL     COLONOSCOPY  2015   INGUINAL HERNIA REPAIR Left 11/30/2016   Procedure: HERNIA REPAIR  INGUINAL ADULT;  Surgeon: Christene Lye, MD;  Location: [facility] ORS;  Service: General;  Laterality: Left;    Family History  Problem Relation Age of Onset   Dementia Mother    Cancer Father 73       Colon   Thyroid disease Sister    Mesothelioma Brother    COPD Brother     Social History   Tobacco Use   Smoking status: Former    Packs/day: 0.50    Years: 20.00    Total pack years: 10.00    Types: Cigarettes    Start date: 08/16/1986    Quit date: 08/16/2006    Years since quitting: 15.6   Smokeless tobacco: Never   Tobacco comments:    smoking cessation materials not required  Substance Use Topics   Alcohol use: No    Alcohol/week: 0.0 standard drinks of alcohol     Current Outpatient Medications:    acetaminophen (TYLENOL) 500 MG tablet, Take 1 tablet (500 mg total) by mouth every 6 (six) hours as needed. (Patient taking differently: Take 500 mg by mouth 2 (two) times daily.), Disp: 30 tablet, Rfl: 0   Ascorbic Acid (VITAMIN C) 1000 MG tablet, Take 1,000 mg by mouth daily., Disp: , Rfl:    aspirin EC 81 MG tablet, Take 1 tablet (81 mg total)  by mouth daily., Disp: 30 tablet, Rfl: 0   atorvastatin (LIPITOR) 40 MG tablet, TAKE 1 TABLET(40 MG) BY MOUTH DAILY, Disp: 90 tablet, Rfl: 3   MULTIPLE VITAMIN PO, Take 1 tablet by mouth daily., Disp: , Rfl:   No Known Allergies  I personally reviewed active problem list, medication list, allergies, family history, social history, health maintenance with the patient/caregiver today.   ROS  Constitutional: Negative for fever or weight change.  Respiratory: Negative for cough and shortness of breath.   Cardiovascular: Negative for chest pain or palpitations.  Gastrointestinal: Negative for abdominal pain, no bowel changes.  Musculoskeletal: Negative for gait problem or joint swelling.  Skin: Negative for rash.  Neurological: Negative for dizziness or headache.  No other specific complaints in a complete review of systems  (except as listed in HPI above).   Objective  Vitals:   04/12/22 0740  BP: 132/74  Pulse: 74  Resp: 16  SpO2: 95%  Weight: 238 lb (108 kg)  Height: '5\' 10"'$  (1.778 m)    Body mass index is 34.15 kg/m.  Physical Exam  Constitutional: Patient appears well-developed and well-nourished. Obese  No distress.  HEENT: head atraumatic, normocephalic, pupils equal and reactive to light, neck supple, throat within normal limits Cardiovascular: Normal rate, regular rhythm and normal heart sounds.  No murmur heard. No BLE edema. Pulmonary/Chest: Effort normal and breath sounds normal. No respiratory distress. Abdominal: Soft.  There is no tenderness. Psychiatric: Patient has a normal mood and affect. behavior is normal. Judgment and thought content normal.   PHQ2/9:    04/12/2022    7:41 AM 10/13/2021    7:37 AM 09/08/2021    8:16 AM 04/23/2021    9:49 AM 04/15/2021    8:12 AM  Depression screen PHQ 2/9  Decreased Interest 0 0 0 0 0  Down, Depressed, Hopeless 0 0 0 0 0  PHQ - 2 Score 0 0 0 0 0  Altered sleeping 0 0 0    Tired, decreased energy 0 0 0    Change in appetite 0 0 0    Feeling bad or failure about yourself  0 0 0    Trouble concentrating 0 0 0    Moving slowly or fidgety/restless 0 0 0    Suicidal thoughts 0 0 0    PHQ-9 Score 0 0 0      phq 9 is negative   Fall Risk:    04/12/2022    7:40 AM 10/13/2021    7:37 AM 09/08/2021    8:15 AM 04/23/2021    9:51 AM 04/15/2021    8:12 AM  Fall Risk   Falls in the past year? 0 0 0 0 0  Number falls in past yr: 0 0 0 0 0  Injury with Fall? 0 0 0 0 0  Risk for fall due to : No Fall Risks No Fall Risks No Fall Risks No Fall Risks No Fall Risks  Follow up Falls prevention discussed Falls prevention discussed Falls prevention discussed Falls prevention discussed Falls prevention discussed      Functional Status Survey: Is the patient deaf or have difficulty hearing?: Yes Does the patient have difficulty seeing, even when  wearing glasses/contacts?: No Does the patient have difficulty concentrating, remembering, or making decisions?: No Does the patient have difficulty walking or climbing stairs?: No Does the patient have difficulty dressing or bathing?: No Does the patient have difficulty doing errands alone such as visiting a doctor's office or shopping?:  No    Assessment & Plan  1. Dilation of aorta ([facility])  On statin therapy, recheck in 2024  2. Senile purpura ([facility])  Stable   3. Pre-diabetes  Advised to cut down on sodas and sweet tea  4. Primary osteoarthritis of right knee  Stable  5. Atherosclerosis of aorta ([facility])  - Lipid panel  6. Pure hypercholesterolemia   7. Hyperglycemia   8. Long-term use of high-risk medication  - COMPLETE METABOLIC PANEL WITH GFR  9. Tremor of both hands  - atenolol (TENORMIN) 25 MG tablet; Take 1 tablet (25 mg total) by mouth every evening.  Dispense: 90 tablet; Refill: 1  10. Need for shingles vaccine  - Zoster Vaccine Adjuvanted [facility]) injection; Inject 0.5 mLs into the muscle once for 1 dose.  Dispense: 0.5 mL; Refill: 0

## 2022-04-12 ENCOUNTER — Ambulatory Visit (INDEPENDENT_AMBULATORY_CARE_PROVIDER_SITE_OTHER): Payer: PPO | Admitting: Family Medicine

## 2022-04-12 ENCOUNTER — Encounter: Payer: Self-pay | Admitting: Family Medicine

## 2022-04-12 VITALS — BP 132/74 | HR 74 | Resp 16 | Ht 70.0 in | Wt 238.0 lb

## 2022-04-12 DIAGNOSIS — Z23 Encounter for immunization: Secondary | ICD-10-CM

## 2022-04-12 DIAGNOSIS — I77819 Aortic ectasia, unspecified site: Secondary | ICD-10-CM

## 2022-04-12 DIAGNOSIS — M1711 Unilateral primary osteoarthritis, right knee: Secondary | ICD-10-CM

## 2022-04-12 DIAGNOSIS — R739 Hyperglycemia, unspecified: Secondary | ICD-10-CM

## 2022-04-12 DIAGNOSIS — D692 Other nonthrombocytopenic purpura: Secondary | ICD-10-CM

## 2022-04-12 DIAGNOSIS — R7303 Prediabetes: Secondary | ICD-10-CM | POA: Diagnosis not present

## 2022-04-12 DIAGNOSIS — R251 Tremor, unspecified: Secondary | ICD-10-CM | POA: Diagnosis not present

## 2022-04-12 DIAGNOSIS — I700 Atherosclerosis of aorta: Secondary | ICD-10-CM

## 2022-04-12 DIAGNOSIS — E7800 Pure hypercholesterolemia, unspecified: Secondary | ICD-10-CM | POA: Diagnosis not present

## 2022-04-12 DIAGNOSIS — Z79899 Other long term (current) drug therapy: Secondary | ICD-10-CM | POA: Diagnosis not present

## 2022-04-12 MED ORDER — SHINGRIX 50 MCG/0.5ML IM SUSR: 0.5000 mL | Freq: Once | INTRAMUSCULAR | 0 refills | Status: AC

## 2022-04-12 MED ORDER — ATENOLOL 25 MG PO TABS: 25.0000 mg | ORAL_TABLET | Freq: Every evening | ORAL | 1 refills | Status: DC

## 2022-04-13 LAB — COMPLETE METABOLIC PANEL WITH GFR
AG Ratio: 2 (calc) (ref 1.0–2.5)
ALT: 19 U/L (ref 9–46)
AST: 18 U/L (ref 10–35)
Albumin: 4.4 g/dL (ref 3.6–5.1)
Alkaline phosphatase (APISO): 112 U/L (ref 35–144)
BUN: 15 mg/dL (ref 7–25)
CO2: 24 mmol/L (ref 20–32)
Calcium: 9.1 mg/dL (ref 8.6–10.3)
Chloride: 104 mmol/L (ref 98–110)
Creat: 1.18 mg/dL (ref 0.70–1.35)
Globulin: 2.2 g/dL (calc) (ref 1.9–3.7)
Glucose, Bld: 104 mg/dL — ABNORMAL HIGH (ref 65–99)
Potassium: 4.8 mmol/L (ref 3.5–5.3)
Sodium: 139 mmol/L (ref 135–146)
Total Bilirubin: 0.7 mg/dL (ref 0.2–1.2)
Total Protein: 6.6 g/dL (ref 6.1–8.1)
eGFR: 67 mL/min/{1.73_m2} (ref >=60)

## 2022-04-13 LAB — LIPID PANEL
Cholesterol: 128 mg/dL (ref <200)
HDL: 47 mg/dL (ref >=40)
LDL Cholesterol (Calc): 62 mg/dL (calc)
Non-HDL Cholesterol (Calc): 81 mg/dL (calc) (ref <130)
Total CHOL/HDL Ratio: 2.7 (calc) (ref <5.0)
Triglycerides: 104 mg/dL (ref <150)

## 2022-04-20 DIAGNOSIS — Z961 Presence of intraocular lens: Secondary | ICD-10-CM | POA: Diagnosis not present

## 2022-04-27 ENCOUNTER — Ambulatory Visit (INDEPENDENT_AMBULATORY_CARE_PROVIDER_SITE_OTHER): Payer: PPO

## 2022-04-27 VITALS — BP 126/78 | HR 67 | Temp 97.7°F | Resp 16 | Ht 70.0 in | Wt 240.9 lb

## 2022-04-27 DIAGNOSIS — Z0000 Encounter for general adult medical examination without abnormal findings: Secondary | ICD-10-CM | POA: Diagnosis not present

## 2022-04-27 DIAGNOSIS — Z23 Encounter for immunization: Secondary | ICD-10-CM | POA: Diagnosis not present

## 2022-04-27 NOTE — Progress Notes (Signed)
Subjective:   Lee Harvey is a 71 y.o. male who presents for Medicare Annual/Subsequent preventive examination.  Review of Systems    Defer to PCP.       Objective:    There were no vitals filed for this visit. There is no height or weight on file to calculate BMI.     04/23/2021    9:50 AM 04/22/2020    3:50 PM 01/18/2019    9:53 AM 01/13/2018    9:38 AM 05/23/2017    8:40 AM 11/30/2016    5:57 AM 11/22/2016   11:23 AM  Advanced Directives  Does Patient Have a Medical Advance Directive? No No Yes No;Yes No Yes Yes  Type of Advance Directive   Healthcare Power of Attorney Living will  Living will Living will  Does patient want to make changes to medical advance directive?    Yes (MAU/Ambulatory/Procedural Areas - Information given)  No - Patient declined   Copy of [place] in Chart?   No - copy requested      Would patient like information on creating a medical advance directive? Yes (MAU/Ambulatory/Procedural Areas - Information given) Yes (MAU/Ambulatory/Procedural Areas - Information given)         Current Medications (verified) Outpatient Encounter Medications as of 04/27/2022  Medication Sig   acetaminophen (TYLENOL) 500 MG tablet Take 1 tablet (500 mg total) by mouth every 6 (six) hours as needed. (Patient taking differently: Take 500 mg by mouth 2 (two) times daily.)   Ascorbic Acid (VITAMIN C) 1000 MG tablet Take 1,000 mg by mouth daily.   aspirin EC 81 MG tablet Take 1 tablet (81 mg total) by mouth daily.   atenolol (TENORMIN) 25 MG tablet Take 1 tablet (25 mg total) by mouth every evening.   atorvastatin (LIPITOR) 40 MG tablet TAKE 1 TABLET(40 MG) BY MOUTH DAILY   MULTIPLE VITAMIN PO Take 1 tablet by mouth daily.   No facility-administered encounter medications on file as of 04/27/2022.    Allergies (verified) Patient has no known allergies.   History: Past Medical History:  Diagnosis Date   Arthritis    KNEE RIGHT   GERD (gastroesophageal  reflux disease)    OCC   History of hemorrhoids    HL (hearing loss)    Hyperglycemia    Hyperlipidemia    Left inguinal hernia    Tremors of nervous system    Trigger finger    Past Surgical History:  Procedure Laterality Date   CATARACT EXTRACTION, BILATERAL     COLONOSCOPY  2015   INGUINAL HERNIA REPAIR Left 11/30/2016   Procedure: HERNIA REPAIR INGUINAL ADULT;  Surgeon: Christene Lye, MD;  Location: [facility] ORS;  Service: General;  Laterality: Left;   Family History  Problem Relation Age of Onset   Dementia Mother    Cancer Father 54       Colon   Thyroid disease Sister    Mesothelioma Brother    COPD Brother    Social History   Socioeconomic History   Marital status: Single    Spouse name: Not on file   Number of children: 0   Years of education: Not on file   Highest education level: 12th grade  Occupational History   Occupation: Retired  Tobacco Use   Smoking status: Former    Packs/day: 0.50    Years: 20.00    Total pack years: 10.00    Types: Cigarettes    Start date: 08/16/1986  Quit date: 08/16/2006    Years since quitting: 15.7   Smokeless tobacco: Never   Tobacco comments:    smoking cessation materials not required  Vaping Use   Vaping Use: Never used  Substance and Sexual Activity   Alcohol use: No    Alcohol/week: 0.0 standard drinks of alcohol   Drug use: No   Sexual activity: Not Currently    Partners: Female  Other Topics Concern   Not on file  Social History Narrative   He lives alone, brother died 02-18-18   He was married twice but never had children   He is very close to his sister, but she lives in [place]   He sees his nephew on a regular basis.    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   Has a friend that he eats breakfast with every morning, also plays disc golf a few times a week with another friend   Social Determinants of Health   Financial Resource Strain: Low Risk  (09/08/2021)   Overall Financial Resource Strain  (CARDIA)    Difficulty of Paying Living Expenses: Not hard at all  Food Insecurity: No Food Insecurity (09/08/2021)   Hunger Vital Sign    Worried About [place] in the Last Year: Never true    [place] in the Last Year: Never true  Transportation Needs: No Transportation Needs (09/08/2021)   PRAPARE - Hydrologist (Medical): No    Lack of Transportation (Non-Medical): No  Physical Activity: Insufficiently Active (09/08/2021)   Exercise Vital Sign    Days of Exercise per Week: 2 days    Minutes of Exercise per Session: 50 min  Stress: No Stress Concern Present (09/08/2021)   [place]    Feeling of Stress : Only a little  Social Connections: Socially Isolated (09/08/2021)   Social Connection and Isolation Panel [NHANES]    Frequency of Communication with Friends and Family: More than three times a week    Frequency of Social Gatherings with Friends and Family: More than three times a week    Attends Religious Services: Never    Marine scientist or Organizations: No    Attends Archivist Meetings: Never    Marital Status: Divorced    Tobacco Counseling Counseling given: Not Answered Tobacco comments: smoking cessation materials not required   Clinical Intake:                 Diabetic?No         Activities of Daily Living    04/12/2022    7:41 AM 10/13/2021    7:37 AM  In your present state of health, do you have any difficulty performing the following activities:  Hearing? 1 1  Vision? 0 0  Difficulty concentrating or making decisions? 0 0  Walking or climbing stairs? 0 0  Dressing or bathing? 0 0  Doing errands, shopping? 0 0    Patient Care Team: Steele Sizer, MD as PCP - General (Family Medicine) Lorelee Cover., MD as Consulting Physician (Ophthalmology)  Indicate any recent Medical Services you may have received from  other than [facility] providers in the past year (date may be approximate).     Assessment:   This is a routine wellness examination for Lee Harvey.  Hearing/Vision screen No results found.  Dietary issues and exercise activities discussed:     Goals Addressed  None   Depression Screen    04/12/2022    7:41 AM 10/13/2021    7:37 AM 09/08/2021    8:16 AM 04/23/2021    9:49 AM 04/15/2021    8:12 AM 10/10/2020    8:43 AM 08/27/2020    8:38 AM  PHQ 2/9 Scores  PHQ - 2 Score 0 0 0 0 0 0 1  PHQ- 9 Score 0 0 0   0 2    Fall Risk    04/12/2022    7:40 AM 10/13/2021    7:37 AM 09/08/2021    8:15 AM 04/23/2021    9:51 AM 04/15/2021    8:12 AM  Fall Risk   Falls in the past year? 0 0 0 0 0  Number falls in past yr: 0 0 0 0 0  Injury with Fall? 0 0 0 0 0  Risk for fall due to : No Fall Risks No Fall Risks No Fall Risks No Fall Risks No Fall Risks  Follow up Falls prevention discussed Falls prevention discussed Falls prevention discussed Falls prevention discussed Falls prevention discussed    FALL RISK PREVENTION PERTAINING TO THE HOME:  Any stairs in or around the home? Yes  If so, are there any without handrails? No  Home free of loose throw rugs in walkways, pet beds, electrical cords, etc? Yes  Adequate lighting in your home to reduce risk of falls? Yes   ASSISTIVE DEVICES UTILIZED TO PREVENT FALLS:  Life alert? No  Use of a cane, walker or w/c? No  Grab bars in the bathroom? No  Shower chair or bench in shower? No  Elevated toilet seat or a handicapped toilet? No   TIMED UP AND GO:  Was the test performed? Yes .  Length of time to ambulate 10 feet: 3 sec.   Gait steady and fast without use of assistive device  Cognitive Function:        01/18/2019    9:58 AM 01/13/2018    9:39 AM  6CIT Screen  What Year? 0 points 0 points  What month? 0 points 0 points  What time? 0 points 0 points  Count back from 20 0 points 0 points  Months in reverse 0 points 0 points  Repeat phrase 0  points 6 points  Total Score 0 points 6 points    Immunizations Immunization History  Administered Date(s) Administered   Fluad Quad(high Dose 65+) 05/21/2019, 04/22/2020, 04/15/2021   Influenza, High Dose Seasonal PF 08/01/2018   Influenza, Seasonal, Injecte, Preservative Fre 08/18/2011   Influenza,inj,Quad PF,6+ Mos 04/11/2014, 04/14/2015, 04/15/2016, 05/23/2017   Influenza-Unspecified 04/11/2014   PFIZER(Purple Top)SARS-COV-2 Vaccination 09/26/2019, 10/17/2019, 04/29/2021   Pneumococcal Conjugate-13 01/13/2018   Pneumococcal Polysaccharide-23 01/18/2019   Tdap 04/11/2014   Zoster, Live 04/15/2016    TDAP status: Up to date  Flu Vaccine status: Completed at today's visit  Pneumococcal vaccine status: Up to date  Covid-19 vaccine status: Completed vaccines  Qualifies for Shingles Vaccine? Yes   Zostavax completed No   Shingrix Completed?: No.    Education has been provided regarding the importance of this vaccine. Patient has been advised to call insurance company to determine out of pocket expense if they have not yet received this vaccine. Advised may also receive vaccine at local pharmacy or Health Dept. Verbalized acceptance and understanding.  Screening Tests Health Maintenance  Topic Date Due   Zoster Vaccines- Shingrix (1 of 2) Never done   COVID-19 Vaccine (4 - Pfizer risk series)  06/24/2021   INFLUENZA VACCINE  03/16/2022   TETANUS/TDAP  04/11/2024   Fecal DNA (Cologuard)  04/21/2024   Pneumonia Vaccine 46+ Years old  Completed   Hepatitis C Screening  Completed   HPV VACCINES  Aged Out    Health Maintenance  Health Maintenance Due  Topic Date Due   Zoster Vaccines- Shingrix (1 of 2) Never done   COVID-19 Vaccine (4 - Pfizer risk series) 06/24/2021   INFLUENZA VACCINE  03/16/2022    Colorectal cancer screening: Type of screening: Cologuard. Completed 04/21/2021. Repeat every 3 years  Lung Cancer Screening: (Low Dose CT Chest recommended if Age 48-80  years, 30 pack-year currently smoking OR have quit w/in 15years.) does not qualify.   Lung Cancer Screening Referral: N/A  Additional Screening:  Hepatitis C Screening: does not qualify; Completed 04/12/2013  Vision Screening: Recommended annual ophthalmology exams for early detection of glaucoma and other disorders of the eye. Is the patient up to date with their annual eye exam?  Yes  Who is the provider or what is the name of the office in which the patient attends annual eye exams? Dr. Gloriann Loan If pt is not established with a provider, would they like to be referred to a provider to establish care? No .   Dental Screening: Recommended annual dental exams for proper oral hygiene  Community Resource Referral / Chronic Care Management: CRR required this visit?  No   CCM required this visit?  No      Plan:     I have personally reviewed and noted the following in the patient's chart:   Medical and social history Use of alcohol, tobacco or illicit drugs  Current medications and supplements including opioid prescriptions. Patient is not currently taking opioid prescriptions. Functional ability and status Nutritional status Physical activity Advanced directives List of other physicians Hospitalizations, surgeries, and ER visits in previous 12 months Vitals Screenings to include cognitive, depression, and falls Referrals and appointments  In addition, I have reviewed and discussed with patient certain preventive protocols, quality metrics, and best practice recommendations. A written personalized care plan for preventive services as well as general preventive health recommendations were provided to patient.     Royal Hawthorn, [place]   04/27/2022   Nurse Notes: Face to face 30 minutes spent.  Lee Harvey , Thank you for taking time to come for your Medicare Wellness Visit. I appreciate your ongoing commitment to your health goals. Please review the following plan we discussed and  let me know if I can assist you in the future.   These are the goals we discussed:  Goals      DIET - INCREASE WATER INTAKE     Recommend to drink at least 6-8 8oz glasses of water per day.        This is a list of the screening recommended for you and due dates:  Health Maintenance  Topic Date Due   Zoster (Shingles) Vaccine (1 of 2) Never done   COVID-19 Vaccine (4 - Pfizer risk series) 06/24/2021   Flu Shot  03/16/2022   Tetanus Vaccine  04/11/2024   Cologuard (Stool DNA test)  04/21/2024   Pneumonia Vaccine  Completed   Hepatitis C Screening: USPSTF Recommendation to screen - Ages 18-79 yo.  Completed   HPV Vaccine  Aged Out

## 2022-05-21 ENCOUNTER — Ambulatory Visit: Payer: PPO

## 2022-07-13 ENCOUNTER — Other Ambulatory Visit: Payer: Self-pay | Admitting: Family Medicine

## 2022-07-13 DIAGNOSIS — R251 Tremor, unspecified: Secondary | ICD-10-CM

## 2022-09-13 NOTE — Progress Notes (Signed)
Name: Lee Harvey   MRN: [id number]    DOB: 10-05-1950   Date:09/14/2022       Progress Note  Subjective  Chief Complaint  Annual Exam  HPI  Patient presents for annual CPE.  IPSS Questionnaire (AUA-7): Over the past month.   1)  How often have you had a sensation of not emptying your bladder completely after you finish urinating?  0 - Not at all  2)  How often have you had to urinate again less than two hours after you finished urinating? 0 - Not at all  3)  How often have you found you stopped and started again several times when you urinated?  0 - Not at all  4) How difficult have you found it to postpone urination?  0 - Not at all  5) How often have you had a weak urinary stream?  1 - Less than 1 time in 5  6) How often have you had to push or strain to begin urination?  0 - Not at all  7) How many times did you most typically get up to urinate from the time you went to bed until the time you got up in the morning?  1 - 1 time  Total score:  0-7 mildly symptomatic   8-19 moderately symptomatic   20-35 severely symptomatic     Diet: he cooks at home about twice a week otherwise he eats out , most of the time he goes to the [employer] and eats home cook meals there - a meat and two vegetables , breakfast is biscuit  Exercise: discussed 150 minutes per week  Last Dental Exam: up to date Last Eye Exam: up to date   Depression: phq 9 is negative    09/14/2022    7:41 AM 04/27/2022    9:44 AM 04/12/2022    7:41 AM 10/13/2021    7:37 AM 09/08/2021    8:16 AM  Depression screen PHQ 2/9  Decreased Interest 0 1 0 0 0  Down, Depressed, Hopeless 0 0 0 0 0  PHQ - 2 Score 0 1 0 0 0  Altered sleeping 0 0 0 0 0  Tired, decreased energy 0 0 0 0 0  Change in appetite 0 0 0 0 0  Feeling bad or failure about yourself  0 0 0 0 0  Trouble concentrating 0 0 0 0 0  Moving slowly or fidgety/restless 0 0 0 0 0  Suicidal thoughts 0 0 0 0 0  PHQ-9 Score 0 1 0 0 0    Hypertension:   BP Readings from Last 3 Encounters:  09/14/22 120/74  04/27/22 126/78  04/12/22 132/74    Obesity: Wt Readings from Last 3 Encounters:  09/14/22 238 lb (108 kg)  04/27/22 240 lb 14.4 oz (109.3 kg)  04/12/22 238 lb (108 kg)   BMI Readings from Last 3 Encounters:  09/14/22 34.15 kg/m  04/27/22 34.57 kg/m  04/12/22 34.15 kg/m     Lipids:  Lab Results  Component Value Date   CHOL 128 04/12/2022   CHOL 124 04/15/2021   CHOL 137 04/09/2020   Lab Results  Component Value Date   HDL 47 04/12/2022   HDL 50 04/15/2021   HDL 50 04/09/2020   Lab Results  Component Value Date   LDLCALC 62 04/12/2022   LDLCALC 57 04/15/2021   LDLCALC 69 04/09/2020   Lab Results  Component Value Date   TRIG 104 04/12/2022   TRIG  85 04/15/2021   TRIG 97 04/09/2020   Lab Results  Component Value Date   CHOLHDL 2.7 04/12/2022   CHOLHDL 2.5 04/15/2021   CHOLHDL 2.7 04/09/2020   No results found for: "LDLDIRECT" Glucose:  Glucose, Bld  Date Value Ref Range Status  04/12/2022 104 (H) 65 - 99 mg/dL Final    Comment:    .            Fasting reference interval . For someone without known diabetes, a glucose value between 100 and 125 mg/dL is consistent with prediabetes and should be confirmed with a follow-up test. .   04/15/2021 96 65 - 99 mg/dL Final    Comment:    .            Fasting reference interval .   04/09/2020 108 (H) 65 - 99 mg/dL Final    Comment:    .            Fasting reference interval . For someone without known diabetes, a glucose value between 100 and 125 mg/dL is consistent with prediabetes and should be confirmed with a follow-up test. .     Flowsheet Row Clinical Support from 04/23/2021 in [facility]  AUDIT-C Score 0      Single STD testing and prevention (HIV/chl/gon/syphilis): N/A Sexual history: not in over one year.  Hep C Screening: 04/12/13 Skin cancer: Discussed monitoring for atypical lesions Colorectal  cancer: 04/21/21  Prostate cancer:   Lab Results  Component Value Date   PSA 0.94 08/27/2020   PSA 0.9 05/23/2017   PSA 0.9 04/15/2016     Lung cancer:  Low Dose CT Chest recommended if Age 49-80 years, 30 pack-year currently smoking OR have quit w/in 15 years. Patient is not a candidate for screening   AAA: he had first one in 2019 and is due for a repeat at the end of 2024, ectasia and risk for aneurysm development   RSV: discussed with patient  Tdap: up to date Shingrix: 1 of 2 Pneumonia: up to date Flu: up to date COVID-19: discussed booster   Advanced Care Planning: A voluntary discussion about advance care planning including the explanation and discussion of advance directives.  Discussed health care proxy and Living will, and the patient was able to identify a health care proxy as nephew - Dorene Sorrow .  Patient does not have a living will and power of attorney of health care   Patient Active Problem List   Diagnosis Date Noted   Senile purpura (HCC) 08/27/2020   Dilation of aorta (HCC) 08/06/2018   Atherosclerosis of aorta (HCC) 08/06/2018   Right knee pain 10/20/2016   Hyperlipidemia 04/22/2016   Hyperglycemia 04/15/2016   Difficulty hearing 04/13/2015   Inguinal hernia 04/13/2015   Tremor of both hands 04/13/2015   Excess weight 04/13/2015    Past Surgical History:  Procedure Laterality Date   CATARACT EXTRACTION, BILATERAL     COLONOSCOPY  2015   INGUINAL HERNIA REPAIR Left 11/30/2016   Procedure: HERNIA REPAIR INGUINAL ADULT;  Surgeon: Kieth Brightly, MD;  Location: [facility] ORS;  Service: General;  Laterality: Left;    Family History  Problem Relation Age of Onset   Dementia Mother    Cancer Father 96       Colon   Thyroid disease Sister    Mesothelioma Brother    COPD Brother     Social History   Socioeconomic History   Marital status: Single  Spouse name: Not on file   Number of children: 0   Years of education: Not on file   Highest education  level: 12th grade  Occupational History   Occupation: Retired  Tobacco Use   Smoking status: Former    Packs/day: 0.50    Years: 20.00    Total pack years: 10.00    Types: Cigarettes    Start date: 08/16/1986    Quit date: 08/16/2006    Years since quitting: 16.0   Smokeless tobacco: Never   Tobacco comments:    smoking cessation materials not required  Vaping Use   Vaping Use: Never used  Substance and Sexual Activity   Alcohol use: No    Alcohol/week: 0.0 standard drinks of alcohol   Drug use: No   Sexual activity: Not Currently    Partners: Female  Other Topics Concern   Not on file  Social History Narrative   He lives alone, brother died Feb 23, 2018  He was married twice but never had children   He is very close to his sister, but she lives in [place]   He sees his nephew on a regular basis.    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   Has a friend that he eats breakfast with every morning, also plays disc golf a few times a week with another friend   Social Determinants of Health   Financial Resource Strain: Low Risk  (09/14/2022)   Overall Financial Resource Strain (CARDIA)    Difficulty of Paying Living Expenses: Not hard at all  Food Insecurity: No Food Insecurity (09/14/2022)   Hunger Vital Sign    Worried About Running Out of Food in the Last Year: Never true    Ran Out of Food in the Last Year: Never true  Transportation Needs: No Transportation Needs (09/14/2022)   PRAPARE - Administrator, Civil Service (Medical): No    Lack of Transportation (Non-Medical): No  Physical Activity: Insufficiently Active (09/14/2022)   Exercise Vital Sign    Days of Exercise per Week: 3 days    Minutes of Exercise per Session: 40 min  Stress: No Stress Concern Present (09/14/2022)   [employer] of Occupational Health - Occupational Stress Questionnaire    Feeling of Stress : Only a little  Social Connections: Moderately Isolated (09/14/2022)   Social Connection  and Isolation Panel [NHANES]    Frequency of Communication with Friends and Family: Three times a week    Frequency of Social Gatherings with Friends and Family: More than three times a week    Attends Religious Services: Never    Database administrator or Organizations: Yes    Attends Banker Meetings: 1 to 4 times per year    Marital Status: Divorced  Intimate Partner Violence: Not At Risk (09/14/2022)   Humiliation, Afraid, Rape, and Kick questionnaire    Fear of Current or Ex-Partner: No    Emotionally Abused: No    Physically Abused: No    Sexually Abused: No     Current Outpatient Medications:    acetaminophen (TYLENOL) 500 MG tablet, Take 1 tablet (500 mg total) by mouth every 6 (six) hours as needed., Disp: 30 tablet, Rfl: 0   Ascorbic Acid (VITAMIN C) 1000 MG tablet, Take 1,000 mg by mouth daily., Disp: , Rfl:    aspirin EC 81 MG tablet, Take 1 tablet (81 mg total) by mouth daily., Disp: 30 tablet, Rfl: 0   atenolol (TENORMIN)  25 MG tablet, TAKE 1 TABLET(25 MG) BY MOUTH EVERY EVENING, Disp: 90 tablet, Rfl: 0   atorvastatin (LIPITOR) 40 MG tablet, TAKE 1 TABLET(40 MG) BY MOUTH DAILY, Disp: 90 tablet, Rfl: 3   MULTIPLE VITAMIN PO, Take 1 tablet by mouth daily., Disp: , Rfl:   No Known Allergies   ROS  Constitutional: Negative for fever or weight change.  Respiratory: Negative for cough and shortness of breath.   Cardiovascular: Negative for chest pain or palpitations.  Gastrointestinal: Negative for abdominal pain, no bowel changes.  Musculoskeletal: Negative for gait problem or joint swelling.  Skin: Negative for rash.  Neurological: Negative for dizziness or headache.  No other specific complaints in a complete review of systems (except as listed in HPI above).    Objective  Vitals:   09/14/22 0741  BP: 120/74  Pulse: 94  Resp: 16  SpO2: 97%  Weight: 238 lb (108 kg)  Height: 5\' 10"  (1.778 m)    Body mass index is 34.15 kg/m.  Physical  Exam  Constitutional: Patient appears well-developed and well-nourished. ObeseNo distress.  HENT: Head: Normocephalic and atraumatic. Ears: B TMs ok, no erythema or effusion; Nose: Nose normal. Mouth/Throat: Oropharynx is clear and moist. No oropharyngeal exudate.  Eyes: Conjunctivae and EOM are normal. Pupils are equal, round, and reactive to light. No scleral icterus.  Neck: Normal range of motion. Neck supple. No JVD present. No thyromegaly present.  Cardiovascular: Normal rate, regular rhythm and normal heart sounds.  No murmur heard. No BLE edema. Pulmonary/Chest: Effort normal and breath sounds normal. No respiratory distress. Abdominal: Soft. Bowel sounds are normal, no distension. There is no tenderness. no masses MALE GENITALIA: Normal descended testes bilaterally, no masses palpated, no hernias, no lesions, no discharge RECTAL: Prostate normal size and consistency, no rectal masses or hemorrhoids Musculoskeletal: Normal range of motion, no joint effusions. No gross deformities Neurological: he is alert and oriented to person, place, and time. No cranial nerve deficit. Coordination, balance, strength, speech and gait are normal.  Skin: Skin is warm and dry. No rash noted. No erythema.  Psychiatric: Patient has a normal mood and affect. behavior is normal. Judgment and thought content normal.    Fall Risk:    09/14/2022    7:41 AM 04/12/2022    7:40 AM 10/13/2021    7:37 AM 09/08/2021    8:15 AM 04/23/2021    9:51 AM  Fall Risk   Falls in the past year? 0 0 0 0 0  Number falls in past yr: 0 0 0 0 0  Injury with Fall? 0 0 0 0 0  Risk for fall due to : No Fall Risks No Fall Risks No Fall Risks No Fall Risks No Fall Risks  Follow up Falls prevention discussed Falls prevention discussed Falls prevention discussed Falls prevention discussed Falls prevention discussed     Functional Status Survey: Is the patient deaf or have difficulty hearing?: Yes Does the patient have difficulty  seeing, even when wearing glasses/contacts?: No Does the patient have difficulty concentrating, remembering, or making decisions?: No Does the patient have difficulty walking or climbing stairs?: No Does the patient have difficulty dressing or bathing?: No Does the patient have difficulty doing errands alone such as visiting a doctor's office or shopping?: No    Assessment & Plan  1. Well adult exam  Discussed regular physical activity and better food choices   2. Ectatic aorta (HCC)  - VAS [place] AAA DUPLEX; Future    -Prostate cancer  screening and PSA options (with potential risks and benefits of testing vs not testing) were discussed along with recent recs/guidelines. -USPSTF grade A and B recommendations reviewed with patient; age-appropriate recommendations, preventive care, screening tests, etc discussed and encouraged; healthy living encouraged; see AVS for patient education given to patient -Discussed importance of 150 minutes of physical activity weekly, eat two servings of fish weekly, eat one serving of tree nuts ( cashews, pistachios, pecans, almonds.Marland Kitchen) every other day, eat 6 servings of fruit/vegetables daily and drink plenty of water and avoid sweet beverages.  -Reviewed Health Maintenance: yes

## 2022-09-13 NOTE — Patient Instructions (Incomplete)
Preventive Care 65 Years and Older, Male Preventive care refers to lifestyle choices and visits with your health care provider that can promote health and wellness. Preventive care visits are also called wellness exams. What can I expect for my preventive care visit? Counseling During your preventive care visit, your health care provider may ask about your: Medical history, including: Past medical problems. Family medical history. History of falls. Current health, including: Emotional well-being. Home life and relationship well-being. Sexual activity. Memory and ability to understand (cognition). Lifestyle, including: Alcohol, nicotine or tobacco, and drug use. Access to firearms. Diet, exercise, and sleep habits. Work and work environment. Sunscreen use. Safety issues such as seatbelt and bike helmet use. Physical exam Your health care provider will check your: Height and weight. These may be used to calculate your BMI (body mass index). BMI is a measurement that tells if you are at a healthy weight. Waist circumference. This measures the distance around your waistline. This measurement also tells if you are at a healthy weight and may help predict your risk of certain diseases, such as type 2 diabetes and high blood pressure. Heart rate and blood pressure. Body temperature. Skin for abnormal spots. What immunizations do I need?  Vaccines are usually given at various ages, according to a schedule. Your health care provider will recommend vaccines for you based on your age, medical history, and lifestyle or other factors, such as travel or where you work. What tests do I need? Screening Your health care provider may recommend screening tests for certain conditions. This may include: Lipid and cholesterol levels. Diabetes screening. This is done by checking your blood sugar (glucose) after you have not eaten for a while (fasting). Hepatitis C test. Hepatitis B test. HIV (human  immunodeficiency virus) test. STI (sexually transmitted infection) testing, if you are at risk. Lung cancer screening. Colorectal cancer screening. Prostate cancer screening. Abdominal aortic aneurysm (AAA) screening. You may need this if you are a current or former smoker. Talk with your health care provider about your test results, treatment options, and if necessary, the need for more tests. Follow these instructions at home: Eating and drinking  Eat a diet that includes fresh fruits and vegetables, whole grains, lean protein, and low-fat dairy products. Limit your intake of foods with high amounts of sugar, saturated fats, and salt. Take vitamin and mineral supplements as recommended by your health care provider. Do not drink alcohol if your health care provider tells you not to drink. If you drink alcohol: Limit how much you have to 0-2 drinks a day. Know how much alcohol is in your drink. In the [place] one drink equals one 12 oz bottle of beer (355 mL), one 5 oz glass of wine (148 mL), or one 1 oz glass of hard liquor (44 mL). Lifestyle Brush your teeth every morning and night with fluoride toothpaste. Floss one time each day. Exercise for at least 30 minutes 5 or more days each week. Do not use any products that contain nicotine or tobacco. These products include cigarettes, chewing tobacco, and vaping devices, such as e-cigarettes. If you need help quitting, ask your health care provider. Do not use drugs. If you are sexually active, practice safe sex. Use a condom or other form of protection to prevent STIs. Take aspirin only as told by your health care provider. Make sure that you understand how much to take and what form to take. Work with your health care provider to find out whether it is safe   and beneficial for you to take aspirin daily. Ask your health care provider if you need to take a cholesterol-lowering medicine (statin). Find healthy ways to manage stress, such  as: Meditation, yoga, or listening to music. Journaling. Talking to a trusted person. Spending time with friends and family. Safety Always wear your seat belt while driving or riding in a vehicle. Do not drive: If you have been drinking alcohol. Do not ride with someone who has been drinking. When you are tired or distracted. While texting. If you have been using any mind-altering substances or drugs. Wear a helmet and other protective equipment during sports activities. If you have firearms in your house, make sure you follow all gun safety procedures. Minimize exposure to UV radiation to reduce your risk of skin cancer. What's next? Visit your health care provider once a year for an annual wellness visit. Ask your health care provider how often you should have your eyes and teeth checked. Stay up to date on all vaccines. This information is not intended to replace advice given to you by your health care provider. Make sure you discuss any questions you have with your health care provider. Document Revised: 01/28/2021 Document Reviewed: 01/28/2021 Elsevier Patient Education  2023 [employer]  

## 2022-09-14 ENCOUNTER — Encounter: Payer: Self-pay | Admitting: Family Medicine

## 2022-09-14 ENCOUNTER — Ambulatory Visit (INDEPENDENT_AMBULATORY_CARE_PROVIDER_SITE_OTHER): Payer: PPO | Admitting: Family Medicine

## 2022-09-14 VITALS — BP 120/74 | HR 94 | Resp 16 | Ht 70.0 in | Wt 238.0 lb

## 2022-09-14 DIAGNOSIS — I77819 Aortic ectasia, unspecified site: Secondary | ICD-10-CM

## 2022-09-14 DIAGNOSIS — Z0000 Encounter for general adult medical examination without abnormal findings: Secondary | ICD-10-CM | POA: Diagnosis not present

## 2022-09-15 ENCOUNTER — Encounter: Payer: Self-pay | Admitting: Family Medicine

## 2022-10-28 ENCOUNTER — Other Ambulatory Visit: Payer: Self-pay | Admitting: Family Medicine

## 2022-10-28 DIAGNOSIS — R251 Tremor, unspecified: Secondary | ICD-10-CM

## 2022-11-06 ENCOUNTER — Other Ambulatory Visit: Payer: Self-pay | Admitting: Family Medicine

## 2022-11-06 DIAGNOSIS — E7800 Pure hypercholesterolemia, unspecified: Secondary | ICD-10-CM

## 2022-11-06 DIAGNOSIS — I700 Atherosclerosis of aorta: Secondary | ICD-10-CM

## 2022-11-15 NOTE — Progress Notes (Unsigned)
Name: Lee Harvey   MRN: [id number]    DOB: 1951-03-16   Date:11/16/2022       Progress Note  Subjective  Chief Complaint  Follow Up  HPI  OA right knee: he only takes Tylenol occasionally. Pain is described as aching, sometimes has effusion but no redness .He has been playing disc golf and putt putt with his nephew and pain does not bother him while playing    Pre-diabetes : polyphagia, polydipsia or polyuria. He gained weight , states drinking sodas again but down to once a day and we will recheck labs next time    Hyperlipidemia: taking Atorvastatin and denies side effects of medication, last labs showed LDL at goal at 62 . No myalgia     Obesity:he lost a few pounds since last visit , he states drinking sodas at most once a day now    Tremors : both hands worse when nervous, left side worse than right He states his aunt had tremors He denies balance problems or memory changes, or falls. I placed a referral last Summer for him to see a neurologist but he decided not to go, he states tremors worse when he gets aggravated. He is taking Atenolol and states it has helped some.      Atherosclerosis of Aorta and Aorta ectasy: repeat US in Dec  2024, he is on statin therapy , last LDL at goal , ordered for repeat doppler placed but he did not get an appointment yet and we will make sure it gets done this year   Senile purpura: usually on arms.  Reassurane given   Patient Active Problem List   Diagnosis Date Noted   Senile purpura 08/27/2020   Dilation of aorta 08/06/2018   Atherosclerosis of aorta 08/06/2018   Right knee pain 10/20/2016   Hyperlipidemia 04/22/2016   Hyperglycemia 04/15/2016   Difficulty hearing 04/13/2015   Inguinal hernia 04/13/2015   Tremor of both hands 04/13/2015   Excess weight 04/13/2015    Past Surgical History:  Procedure Laterality Date   CATARACT EXTRACTION, BILATERAL     COLONOSCOPY  2015   INGUINAL HERNIA REPAIR Left 11/30/2016   Procedure: HERNIA  REPAIR INGUINAL ADULT;  Surgeon: Christene Lye, MD;  Location: [facility] ORS;  Service: General;  Laterality: Left;    Family History  Problem Relation Age of Onset   Dementia Mother    Cancer Father 7       Colon   Thyroid disease Sister    Mesothelioma Brother    COPD Brother     Social History   Tobacco Use   Smoking status: Former    Packs/day: 0.50    Years: 20.00    Additional pack years: 0.00    Total pack years: 10.00    Types: Cigarettes    Start date: 08/16/1986    Quit date: 08/16/2006    Years since quitting: 16.2   Smokeless tobacco: Never   Tobacco comments:    smoking cessation materials not required  Substance Use Topics   Alcohol use: No    Alcohol/week: 0.0 standard drinks of alcohol     Current Outpatient Medications:    acetaminophen (TYLENOL) 500 MG tablet, Take 1 tablet (500 mg total) by mouth every 6 (six) hours as needed., Disp: 30 tablet, Rfl: 0   Ascorbic Acid (VITAMIN C) 1000 MG tablet, Take 1,000 mg by mouth daily., Disp: , Rfl:    aspirin EC 81 MG tablet, Take 1 tablet (81 mg  total) by mouth daily., Disp: 30 tablet, Rfl: 0   atenolol (TENORMIN) 25 MG tablet, TAKE 1 TABLET(25 MG) BY MOUTH EVERY EVENING, Disp: 90 tablet, Rfl: 0   atorvastatin (LIPITOR) 40 MG tablet, TAKE 1 TABLET(40 MG) BY MOUTH DAILY, Disp: 90 tablet, Rfl: 3   MULTIPLE VITAMIN PO, Take 1 tablet by mouth daily., Disp: , Rfl:   No Known Allergies  I personally reviewed active problem list, medication list, allergies, family history, social history, health maintenance with the patient/caregiver today.   ROS  Constitutional: Negative for fever or weight change.  Respiratory: Negative for cough and shortness of breath.   Cardiovascular: Negative for chest pain or palpitations.  Gastrointestinal: Negative for abdominal pain, no bowel changes.  Musculoskeletal: Negative for gait problem or joint swelling.  Skin: Negative for rash.  Neurological: Negative for dizziness or  headache.  No other specific complaints in a complete review of systems (except as listed in HPI above).   Objective  Vitals:   11/16/22 0954  BP: 122/76  Pulse: 82  Resp: 16  SpO2: 94%  Weight: 234 lb (106.1 kg)  Height: 5\' 10"  (1.778 m)    Body mass index is 33.58 kg/m.  Physical Exam  Constitutional: Patient appears well-developed and well-nourished. Obese  No distress.  HEENT: head atraumatic, normocephalic, pupils equal and reactive to light, neck supple Cardiovascular: Normal rate, regular rhythm and normal heart sounds.  No murmur heard. No BLE edema. Pulmonary/Chest: Effort normal and breath sounds normal. No respiratory distress. Abdominal: Soft.  There is no tenderness. Psychiatric: Patient has a normal mood and affect. behavior is normal. Judgment and thought content normal.    PHQ2/9:    11/16/2022    9:53 AM 09/14/2022    7:41 AM 04/27/2022    9:44 AM 04/12/2022    7:41 AM 10/13/2021    7:37 AM  Depression screen PHQ 2/9  Decreased Interest 0 0 1 0 0  Down, Depressed, Hopeless 0 0 0 0 0  PHQ - 2 Score 0 0 1 0 0  Altered sleeping 0 0 0 0 0  Tired, decreased energy 0 0 0 0 0  Change in appetite 0 0 0 0 0  Feeling bad or failure about yourself  0 0 0 0 0  Trouble concentrating 0 0 0 0 0  Moving slowly or fidgety/restless 0 0 0 0 0  Suicidal thoughts 0 0 0 0 0  PHQ-9 Score 0 0 1 0 0    phq 9 is negative   Fall Risk:    11/16/2022    9:53 AM 09/14/2022    7:41 AM 04/12/2022    7:40 AM 10/13/2021    7:37 AM 09/08/2021    8:15 AM  Fall Risk   Falls in the past year? 0 0 0 0 0  Number falls in past yr: 0 0 0 0 0  Injury with Fall? 0 0 0 0 0  Risk for fall due to : No Fall Risks No Fall Risks No Fall Risks No Fall Risks No Fall Risks  Follow up Falls prevention discussed Falls prevention discussed Falls prevention discussed Falls prevention discussed Falls prevention discussed      Functional Status Survey: Is the patient deaf or have difficulty  hearing?: Yes Does the patient have difficulty seeing, even when wearing glasses/contacts?: No Does the patient have difficulty concentrating, remembering, or making decisions?: No Does the patient have difficulty walking or climbing stairs?: No Does the patient have difficulty dressing or  bathing?: No Does the patient have difficulty doing errands alone such as visiting a doctor's office or shopping?: No    Assessment & Plan  1. Atherosclerosis of aorta  On statin therapy   2. Tremor of both hands  - atenolol (TENORMIN) 25 MG tablet; Take 1 tablet (25 mg total) by mouth daily.  Dispense: 90 tablet; Refill: 1  3. Senile purpura  Reassurance given   4. Pure hypercholesterolemia   5. Pre-diabetes   6. Dilation of aorta   7. Primary osteoarthritis of right knee

## 2022-11-16 ENCOUNTER — Ambulatory Visit (INDEPENDENT_AMBULATORY_CARE_PROVIDER_SITE_OTHER): Payer: PPO | Admitting: Family Medicine

## 2022-11-16 ENCOUNTER — Encounter: Payer: Self-pay | Admitting: Family Medicine

## 2022-11-16 VITALS — BP 122/76 | HR 82 | Resp 16 | Ht 70.0 in | Wt 234.0 lb

## 2022-11-16 DIAGNOSIS — R251 Tremor, unspecified: Secondary | ICD-10-CM | POA: Diagnosis not present

## 2022-11-16 DIAGNOSIS — I77819 Aortic ectasia, unspecified site: Secondary | ICD-10-CM | POA: Diagnosis not present

## 2022-11-16 DIAGNOSIS — E7800 Pure hypercholesterolemia, unspecified: Secondary | ICD-10-CM

## 2022-11-16 DIAGNOSIS — D692 Other nonthrombocytopenic purpura: Secondary | ICD-10-CM | POA: Diagnosis not present

## 2022-11-16 DIAGNOSIS — I700 Atherosclerosis of aorta: Secondary | ICD-10-CM

## 2022-11-16 DIAGNOSIS — M1711 Unilateral primary osteoarthritis, right knee: Secondary | ICD-10-CM | POA: Diagnosis not present

## 2022-11-16 DIAGNOSIS — R7303 Prediabetes: Secondary | ICD-10-CM | POA: Diagnosis not present

## 2022-11-16 MED ORDER — ATENOLOL 25 MG PO TABS: 25.0000 mg | ORAL_TABLET | Freq: Every day | ORAL | 1 refills | Status: DC

## 2022-12-20 ENCOUNTER — Ambulatory Visit (INDEPENDENT_AMBULATORY_CARE_PROVIDER_SITE_OTHER): Payer: PPO

## 2022-12-20 DIAGNOSIS — I77819 Aortic ectasia, unspecified site: Secondary | ICD-10-CM

## 2022-12-22 ENCOUNTER — Other Ambulatory Visit: Payer: Self-pay

## 2022-12-22 DIAGNOSIS — I719 Aortic aneurysm of unspecified site, without rupture: Secondary | ICD-10-CM

## 2022-12-28 ENCOUNTER — Ambulatory Visit (INDEPENDENT_AMBULATORY_CARE_PROVIDER_SITE_OTHER): Payer: PPO | Admitting: Vascular Surgery

## 2022-12-28 VITALS — BP 163/82 | HR 51 | Resp 17 | Ht 70.0 in | Wt 238.8 lb

## 2022-12-28 DIAGNOSIS — I714 Abdominal aortic aneurysm, without rupture, unspecified: Secondary | ICD-10-CM | POA: Insufficient documentation

## 2022-12-28 DIAGNOSIS — I7143 Infrarenal abdominal aortic aneurysm, without rupture: Secondary | ICD-10-CM

## 2022-12-28 DIAGNOSIS — E7800 Pure hypercholesterolemia, unspecified: Secondary | ICD-10-CM

## 2022-12-28 NOTE — Patient Instructions (Signed)
Abdominal Aortic Aneurysm  An aneurysm is a bulge in an artery. It happens when blood pushes against a weak or damaged artery wall. An abdominal aortic aneurysm (AAA) is an aneurysm in the lower part of the aorta. The aorta is the main artery of the body. It supplies blood from the heart to the rest of the body. Most aneurysms do not cause symptoms. Some can cause problems. An AAA can cause two serious problems: It can grow and then burst (rupture). It can cause blood to flow between the layers of the wall of the aorta through a tear (aortic dissection). These problems are medical emergencies. They can cause bleeding inside the body. They should be treated right away. What are the causes? The exact cause of this condition is not known. What increases the risk? You may be more likely to develop an AAA if: You are male and 60 years of age or older. You are Caucasian. You use or have used nicotine or tobacco products. You have a family history of aneurysms. You have an injury or trauma to your aorta. You are obese. You have: Arteriosclerosis. This is when your arteries harden. Arteritis. This is inflammation of the walls of an artery. Certain genetic conditions. Infectious aortitis. This is an infection in the wall of your aorta caused by bacteria. High cholesterol. High blood pressure (hypertension). What are the signs or symptoms? Symptoms depend on how big the aneurysm is and how fast it is growing. Most grow slowly and do not cause symptoms. In some cases, you may have: Severe pain in your abdomen, side, or lower back. A feeling of fullness after you eat only a little bit of food. A throbbing lump in your abdomen. Painful feet or toes. You may also have discolored skin or sores on your feet or toes. Constipation or trouble peeing (urinating). If your AAA bursts, you may: Feel sudden, severe pain in your abdomen, side, or back. Have nausea or vomiting. Feel light-headed or  faint. How is this diagnosed? This condition may be diagnosed with a physical exam to check for throbbing and listen to blood flow in your abdomen. You may also have tests, such as: Ultrasound. X-rays. CT scan. MRI. Angiogram. This test checks your arteries for damage or blockage. Because most AAAs that have not burst do not cause symptoms, they are often found during exams for other conditions. How is this treated? Treatment depends on: The size of your aneurysm. How fast your aneurysm is growing. Your age. Risk factors for a burst AAA. If your aneurysm is smaller than 2 inches (5 cm), your health care provider may: Keep an eye on it to see if it gets bigger. You may need to have an ultrasound every 6-12 months, every year, or every few years. Give you medicines to control blood pressure, treat pain, or fight infection. If your aneurysm is larger than 2 inches (5 cm), you may need surgery. Follow these instructions at home: Eating and drinking  Eat a heart-healthy diet. This includes lots of fresh fruits and vegetables, whole grains, low-fat (lean) protein, and low-fat dairy products. Avoid foods that are high in saturated fat and cholesterol. These include red meat and some dairy products. Lifestyle  Do not use any products that contain nicotine or tobacco. These products include cigarettes, chewing tobacco, and vaping devices, such as e-cigarettes. If you need help quitting, ask your provider. Check your blood pressure often. Follow instructions on how to keep it within normal limits. Have your   cholesterol levels checked often. Follow instructions on how to keep levels within normal limits. Stay active. Exercise on a regular basis. Talk with your provider about how often to exercise and which types of exercise are safe for you. Maintain a healthy weight. Alcohol use Do not drink alcohol if: Your provider tells you not to drink. You are pregnant, may be pregnant, or plan to  become pregnant. If you drink alcohol: Limit how much you have to: 0-1 drink a day if you are male. 0-2 drinks a day if you are male. Know how much alcohol is in your drink. In the [place] one drink is one 12 oz bottle of beer (355 mL), one 5 oz glass of wine (148 mL), or one 1 oz glass of hard liquor (44 mL). General instructions Take over-the-counter and prescription medicines only as told by your provider. You may have to avoid lifting. Ask your provider how much you can safely lift. If you can, learn your family's health history. Keep all follow-up visits. Your provider will need to watch the size of your aneurysm and how fast it is growing. Where to find more information American Heart Association: heart.org Contact a health care provider if: You have pain in your abdomen, side, or back. You have a throbbing mass in your abdomen. Your heart beats fast when you stand. You have nausea or vomiting. You are constipated or have trouble peeing. You have a fever. Get help right away if: You have sudden, severe pain in your abdomen, side, or back. You feel light-headed, or you faint. You have sweaty, clammy skin. You are short of breath. These symptoms may be an emergency. Get help right away. Call 911. Do not wait to see if the symptoms will go away. Do not drive yourself to the hospital. This information is not intended to replace advice given to you by your health care provider. Make sure you discuss any questions you have with your health care provider. Document Revised: 03/09/2022 Document Reviewed: 03/09/2022 Elsevier Patient Education  2023 Elsevier Inc.  

## 2022-12-28 NOTE — Assessment & Plan Note (Signed)
Recent duplex demonstrates a 3.1 cm abdominal aortic aneurysm.  Recommend: No surgery or intervention is indicated at this time.  The patient has an asymptomatic abdominal aortic aneurysm that is less than 4 cm in maximal diameter.    I have reviewed the natural history of abdominal aortic aneurysm and the small risk of rupture for aneurysm less than 5 cm in size.  However, as these small aneurysms tend to enlarge over time, continued surveillance with ultrasound or CT scan is mandatory.   I have also discussed optimizing medical management with hypertension and lipid control and the importance of abstinence from tobacco.  The patient is also encouraged to exercise a minimum of 30 minutes 4 times a week.   Should the patient develop new onset abdominal or back pain or signs of peripheral embolization they are instructed to seek medical attention immediately and to alert the physician providing care that they have an aneurysm.   The patient voices their understanding.  The patient will return in 12 months with an aortic duplex.

## 2022-12-28 NOTE — Progress Notes (Signed)
Patient ID: Lee Harvey, male   DOB: 04-Feb-1951, 72 y.o.   MRN: [id number]  Chief Complaint  Patient presents with   Establish Care    HPI Lee Harvey is a 72 y.o. male.  I am asked to see the patient by Dr. Carlynn Purl for evaluation of his abdominal aortic aneurysm.  The patient had a recent aortic duplex which showed a 3.1 cm proximal infrarenal aortic aneurysm.  He denies any aneurysm related symptoms. Specifically, the patient denies new back or abdominal pain, or signs of peripheral embolization.  He had an ultrasound about 4 to 5 years ago which showed an ectatic aorta of between 2.5 and 3 cm in maximal diameter that was not frankly aneurysmal.  He has no family history of aneurysm to his knowledge.  He has no other aneurysmal disease to his knowledge.  He does have a history of tobacco use but has stopped smoking.     Past Medical History:  Diagnosis Date   Arthritis    KNEE RIGHT   GERD (gastroesophageal reflux disease)    OCC   History of hemorrhoids    HL (hearing loss)    Hyperglycemia    Hyperlipidemia    Left inguinal hernia    Tremors of nervous system    Trigger finger     Past Surgical History:  Procedure Laterality Date   CATARACT EXTRACTION, BILATERAL     COLONOSCOPY  2015   INGUINAL HERNIA REPAIR Left 11/30/2016   Procedure: HERNIA REPAIR INGUINAL ADULT;  Surgeon: Kieth Brightly, MD;  Location: [facility] ORS;  Service: General;  Laterality: Left;     Family History  Problem Relation Age of Onset   Dementia Mother    Cancer Father 5       Colon   Thyroid disease Sister    Mesothelioma Brother    COPD Brother      Social History   Tobacco Use   Smoking status: Former    Packs/day: 0.50    Years: 20.00    Additional pack years: 0.00    Total pack years: 10.00    Types: Cigarettes    Start date: 08/16/1986    Quit date: 08/16/2006    Years since quitting: 16.3   Smokeless tobacco: Never   Tobacco comments:    smoking cessation materials not  required  Vaping Use   Vaping Use: Never used  Substance Use Topics   Alcohol use: No    Alcohol/week: 0.0 standard drinks of alcohol   Drug use: No     No Known Allergies  Current Outpatient Medications  Medication Sig Dispense Refill   acetaminophen (TYLENOL) 500 MG tablet Take 1 tablet (500 mg total) by mouth every 6 (six) hours as needed. 30 tablet 0   Ascorbic Acid (VITAMIN C) 1000 MG tablet Take 1,000 mg by mouth daily.     aspirin EC 81 MG tablet Take 1 tablet (81 mg total) by mouth daily. 30 tablet 0   atenolol (TENORMIN) 25 MG tablet Take 1 tablet (25 mg total) by mouth daily. 90 tablet 1   atorvastatin (LIPITOR) 40 MG tablet TAKE 1 TABLET(40 MG) BY MOUTH DAILY 90 tablet 3   MULTIPLE VITAMIN PO Take 1 tablet by mouth daily.     No current facility-administered medications for this visit.      REVIEW OF SYSTEMS (Negative unless checked)  Constitutional: [] Weight loss  [] Fever  [] Chills Cardiac: [] Chest pain   [] Chest pressure   [] Palpitations   []   Shortness of breath when laying flat   [] Shortness of breath at rest   [] Shortness of breath with exertion. Vascular:  [] Pain in legs with walking   [] Pain in legs at rest   [] Pain in legs when laying flat   [] Claudication   [] Pain in feet when walking  [] Pain in feet at rest  [] Pain in feet when laying flat   [] History of DVT   [] Phlebitis   [] Swelling in legs   [] Varicose veins   [] Non-healing ulcers Pulmonary:   [] Uses home oxygen   [] Productive cough   [] Hemoptysis   [] Wheeze  [] COPD   [] Asthma Neurologic:  [] Dizziness  [] Blackouts   [] Seizures   [] History of stroke   [] History of TIA  [] Aphasia   [] Temporary blindness   [] Dysphagia   [] Weakness or numbness in arms   [] Weakness or numbness in legs Musculoskeletal:  [x] Arthritis   [] Joint swelling   [] Joint pain   [] Low back pain Hematologic:  [] Easy bruising  [] Easy bleeding   [] Hypercoagulable state   [] Anemic  [] Hepatitis Gastrointestinal:  [] Blood in stool   [] Vomiting blood   [x] Gastroesophageal reflux/heartburn   [] Abdominal pain Genitourinary:  [] Chronic kidney disease   [] Difficult urination  [] Frequent urination  [] Burning with urination   [] Hematuria Skin:  [] Rashes   [] Ulcers   [] Wounds Psychological:  [] History of anxiety   []  History of major depression.    Physical Exam BP (!) 163/82 (BP Location: Left Arm)   Pulse (!) 51   Resp 17   Ht 5\' 10"  (1.778 m)   Wt 238 lb 12.8 oz (108.3 kg)   BMI 34.26 kg/m  Gen:  WD/WN, NAD Head: [place]/AT, No temporalis wasting.  Ear/Nose/Throat: Hearing grossly intact, nares w/o erythema or drainage, oropharynx w/o Erythema/Exudate Eyes: Conjunctiva clear, sclera non-icteric  Neck: trachea midline.  No JVD.  Pulmonary:  Good air movement, respirations not labored, no use of accessory muscles  Cardiac: RRR, no JVD Vascular:  Vessel Right Left  Radial Palpable Palpable                                   Gastrointestinal:. No masses, surgical incisions, or scars. No increased aortic impulse Musculoskeletal: M/S 5/5 throughout.  Extremities without ischemic changes.  No deformity or atrophy. No edema. Neurologic: Sensation grossly intact in extremities.  Symmetrical.  Speech is fluent. Motor exam as listed above. Psychiatric: Judgment intact, Mood & affect appropriate for pt's clinical situation. Dermatologic: No rashes or ulcers noted.  No cellulitis or open wounds.    Radiology VAS [place] AAA DUPLEX  Result Date: 12/22/2022 ABDOMINAL AORTA STUDY Patient Name:  Lee Harvey  Date of Exam:   12/20/2022 Medical Rec #: [id number]     Accession #:    [id number] Date of Birth: [birth year]    Patient Gender: M Patient Age:   6 years Exam Location:  [facility] Procedure:      VAS [place] AAA DUPLEX Referring Phys: Danna Hefty SOWLES --------------------------------------------------------------------------------  Indications: Follow up exam for known AAA. Risk Factors: Hyperlipidemia, past history of smoking.  Comparison  Study: Medicare screening in 2019 showed an ectatic aorta with a                   maximum diameter of 2.5 cm Performing Technologist: Hardie Lora RVT  Examination Guidelines: A complete evaluation includes B-mode imaging, spectral Doppler, color Doppler, and power Doppler as needed of all  accessible portions of each vessel. Bilateral testing is considered an integral part of a complete examination. Limited examinations for reoccurring indications may be performed as noted.  Abdominal Aorta Findings: +-----------+-------+----------+----------+--------+--------+--------+ Location   AP (cm)Trans (cm)PSV (cm/s)WaveformThrombusComments +-----------+-------+----------+----------+--------+--------+--------+ Proximal   2.64   3.11      77                                 +-----------+-------+----------+----------+--------+--------+--------+ Mid        2.11   2.02      108                                +-----------+-------+----------+----------+--------+--------+--------+ Distal     2.10   2.29      85                                 +-----------+-------+----------+----------+--------+--------+--------+ RT CIA Prox1.3    1.3       104                                +-----------+-------+----------+----------+--------+--------+--------+ LT CIA Prox1.4    1.6       84                                 +-----------+-------+----------+----------+--------+--------+--------+  Summary: Abdominal Aorta: There is evidence of abnormal dilatation of the proximal Abdominal aorta. The largest aortic measurement is 3.1 cm. The largest aortic diameter has increased compared to prior exam. Previous diameter measurement was 2.5 cm obtained on 01/30/2018.  *See table(s) above for measurements and observations.  Electronically signed by Levora Dredge MD on 12/22/2022 at 10:12:28 AM.    Final     Labs No results found for this or any previous visit (from the past 2160  hour(s)).  Assessment/Plan:  AAA (abdominal aortic aneurysm) without rupture (HCC) Recent duplex demonstrates a 3.1 cm abdominal aortic aneurysm.  Recommend: No surgery or intervention is indicated at this time.  The patient has an asymptomatic abdominal aortic aneurysm that is less than 4 cm in maximal diameter.    I have reviewed the natural history of abdominal aortic aneurysm and the small risk of rupture for aneurysm less than 5 cm in size.  However, as these small aneurysms tend to enlarge over time, continued surveillance with ultrasound or CT scan is mandatory.   I have also discussed optimizing medical management with hypertension and lipid control and the importance of abstinence from tobacco.  The patient is also encouraged to exercise a minimum of 30 minutes 4 times a week.   Should the patient develop new onset abdominal or back pain or signs of peripheral embolization they are instructed to seek medical attention immediately and to alert the physician providing care that they have an aneurysm.   The patient voices their understanding.  The patient will return in 12 months with an aortic duplex.  Hyperlipidemia lipid control important in reducing the progression of atherosclerotic disease. Continue statin therapy      Festus Barren 12/28/2022, 9:52 AM   This note was created with Dragon medical transcription system.  Any errors from dictation are unintentional.

## 2022-12-28 NOTE — Assessment & Plan Note (Signed)
lipid control important in reducing the progression of atherosclerotic disease. Continue statin therapy  

## 2023-04-26 ENCOUNTER — Other Ambulatory Visit: Payer: Self-pay | Admitting: Family Medicine

## 2023-04-26 DIAGNOSIS — R251 Tremor, unspecified: Secondary | ICD-10-CM

## 2023-04-28 ENCOUNTER — Ambulatory Visit (INDEPENDENT_AMBULATORY_CARE_PROVIDER_SITE_OTHER): Payer: PPO

## 2023-04-28 VITALS — BP 122/72 | Ht 70.0 in | Wt 237.1 lb

## 2023-04-28 DIAGNOSIS — Z0000 Encounter for general adult medical examination without abnormal findings: Secondary | ICD-10-CM

## 2023-04-28 DIAGNOSIS — Z23 Encounter for immunization: Secondary | ICD-10-CM | POA: Diagnosis not present

## 2023-04-28 NOTE — Patient Instructions (Addendum)
Mr. Lee Harvey , Thank you for taking time to come for your Medicare Wellness Visit. I appreciate your ongoing commitment to your health goals. Please review the following plan we discussed and let me know if I can assist you in the future.   Referrals/Orders/Follow-Ups/Clinician Recommendations: none  This is a list of the screening recommended for you and due dates:  Health Maintenance  Topic Date Due   Zoster (Shingles) Vaccine (2 of 2) 06/08/2022   COVID-19 Vaccine (4 - 2023-24 season) 04/17/2023   DTaP/Tdap/Td vaccine (2 - Td or Tdap) 04/11/2024   Cologuard (Stool DNA test)  04/21/2024   Medicare Annual Wellness Visit  04/27/2024   Pneumonia Vaccine  Completed   Flu Shot  Completed   Hepatitis C Screening  Completed   HPV Vaccine  Aged Out    Advanced directives: (Copy Requested) Please bring a copy of your health care power of attorney and living will to the office to be added to your chart at your convenience.  Next Medicare Annual Wellness Visit scheduled for next year: Yes 09/18205 @ 8:15am in person

## 2023-04-28 NOTE — Progress Notes (Signed)
Subjective:   Lee Harvey is a 72 y.o. male who presents for Medicare Annual/Subsequent preventive examination.  Visit Complete: In person  Patient Medicare AWV questionnaire was completed by the patient on (not done); I have confirmed that all information answered by patient is correct and no changes since this date.  Review of Systems    Cardiac Risk Factors include: advanced age (>32men, >46 women);dyslipidemia;male gender;obesity (BMI >30kg/m2)    Objective:    Today's Vitals   04/28/23 0905  BP: 122/72  Weight: 237 lb 1.6 oz (107.5 kg)  Height: 5\' 10"  (1.778 m)   Body mass index is 34.02 kg/m.     04/28/2023    9:17 AM 04/23/2021    9:50 AM 04/22/2020    3:50 PM 01/18/2019    9:53 AM 01/13/2018    9:38 AM 05/23/2017    8:40 AM 11/30/2016    5:57 AM  Advanced Directives  Does Patient Have a Medical Advance Directive? Yes No No Yes No;Yes No Yes  Type of Estate agent of [place];Living will   Healthcare Power of Attorney Living will  Living will  Does patient want to make changes to medical advance directive?     Yes (MAU/Ambulatory/Procedural Areas - Information given)  No - Patient declined  Copy of Healthcare Power of Attorney in Chart?    No - copy requested     Would patient like information on creating a medical advance directive?  Yes (MAU/Ambulatory/Procedural Areas - Information given) Yes (MAU/Ambulatory/Procedural Areas - Information given)        Current Medications (verified) Outpatient Encounter Medications as of 04/28/2023  Medication Sig   acetaminophen (TYLENOL) 500 MG tablet Take 1 tablet (500 mg total) by mouth every 6 (six) hours as needed.   Ascorbic Acid (VITAMIN C) 1000 MG tablet Take 1,000 mg by mouth daily.   aspirin EC 81 MG tablet Take 1 tablet (81 mg total) by mouth daily.   atenolol (TENORMIN) 25 MG tablet TAKE 1 TABLET(25 MG) BY MOUTH DAILY   atorvastatin (LIPITOR) 40 MG tablet TAKE 1 TABLET(40 MG) BY MOUTH DAILY    MULTIPLE VITAMIN PO Take 1 tablet by mouth daily.   No facility-administered encounter medications on file as of 04/28/2023.    Allergies (verified) Patient has no known allergies.   History: Past Medical History:  Diagnosis Date   Arthritis    KNEE RIGHT   GERD (gastroesophageal reflux disease)    OCC   History of hemorrhoids    HL (hearing loss)    Hyperglycemia    Hyperlipidemia    Left inguinal hernia    Tremors of nervous system    Trigger finger    Past Surgical History:  Procedure Laterality Date   CATARACT EXTRACTION, BILATERAL     COLONOSCOPY  2015   INGUINAL HERNIA REPAIR Left 11/30/2016   Procedure: HERNIA REPAIR INGUINAL ADULT;  Surgeon: Kieth Brightly, MD;  Location: [facility] ORS;  Service: General;  Laterality: Left;   Family History  Problem Relation Age of Onset   Dementia Mother    Cancer Father 67       Colon   Thyroid disease Sister    Mesothelioma Brother    COPD Brother    Social History   Socioeconomic History   Marital status: Single    Spouse name: Not on file   Number of children: 0   Years of education: Not on file   Highest education level: 12th grade  Occupational  History   Occupation: Retired  Tobacco Use   Smoking status: Former    Current packs/day: 0.00    Average packs/day: 0.5 packs/day for 20.0 years (10.0 ttl pk-yrs)    Types: Cigarettes    Start date: 08/16/1986    Quit date: 08/16/2006    Years since quitting: 16.7   Smokeless tobacco: Never   Tobacco comments:    smoking cessation materials not required  Vaping Use   Vaping status: Never Used  Substance and Sexual Activity   Alcohol use: No    Alcohol/week: 0.0 standard drinks of alcohol   Drug use: No   Sexual activity: Not Currently    Partners: Female  Other Topics Concern   Not on file  Social History Narrative   He lives alone, brother died February 02, 2018  He was married twice but never had children   He is very close to his sister, but she lives in [place]   He  sees his nephew on a regular basis.    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   Has a friend that he eats breakfast with every morning, also plays disc golf a few times a week with another friend   Social Determinants of Health   Financial Resource Strain: Low Risk  (04/28/2023)   Overall Financial Resource Strain (CARDIA)    Difficulty of Paying Living Expenses: Not hard at all  Food Insecurity: No Food Insecurity (04/28/2023)   Hunger Vital Sign    Worried About Running Out of Food in the Last Year: Never true    Ran Out of Food in the Last Year: Never true  Transportation Needs: No Transportation Needs (04/28/2023)   PRAPARE - Administrator, Civil Service (Medical): No    Lack of Transportation (Non-Medical): No  Physical Activity: Sufficiently Active (04/28/2023)   Exercise Vital Sign    Days of Exercise per Week: 4 days    Minutes of Exercise per Session: 40 min  Recent Concern: Physical Activity - Insufficiently Active (04/25/2023)   Exercise Vital Sign    Days of Exercise per Week: 2 days    Minutes of Exercise per Session: 40 min  Stress: No Stress Concern Present (04/28/2023)   [employer] of Occupational Health - Occupational Stress Questionnaire    Feeling of Stress : Not at all  Social Connections: Moderately Integrated (04/28/2023)   Social Connection and Isolation Panel [NHANES]    Frequency of Communication with Friends and Family: More than three times a week    Frequency of Social Gatherings with Friends and Family: More than three times a week    Attends Religious Services: 1 to 4 times per year    Active Member of [employer] or Organizations: Yes    Attends Banker Meetings: 1 to 4 times per year    Marital Status: Divorced    Tobacco Counseling Counseling given: Not Answered Tobacco comments: smoking cessation materials not required   Clinical Intake:     Pain : No/denies pain     BMI - recorded: 34.02 Nutritional  Status: BMI > 30  Obese Nutritional Risks: None Diabetes: No  How often do you need to have someone help you when you read instructions, pamphlets, or other written materials from your doctor or pharmacy?: 1 - Never  Interpreter Needed?: No  Comments: lives alone Information entered by :: B.Dayana Dalporto,LPN   Activities of Daily Living    04/28/2023    9:18 AM 11/16/2022  9:53 AM  In your present state of health, do you have any difficulty performing the following activities:  Hearing? 1 1  Vision? 0 0  Difficulty concentrating or making decisions? 0 0  Walking or climbing stairs? 0 0  Dressing or bathing? 0 0  Doing errands, shopping? 0 0  Preparing Food and eating ? N   Using the Toilet? N   In the past six months, have you accidently leaked urine? N   Do you have problems with loss of bowel control? N   Managing your Medications? N   Managing your Finances? N   Housekeeping or managing your Housekeeping? N     Patient Care Team: Alba Cory, MD as PCP - General (Family Medicine) Irene Limbo., MD as Consulting Physician (Ophthalmology)  Indicate any recent Medical Services you may have received from other than [facility] providers in the past year (date may be approximate).     Assessment:   This is a routine wellness examination for Bjorn.  Hearing/Vision screen Hearing Screening - Comments:: Inadequate hearing:does not want hearing right now Vision Screening - Comments:: Adequate vision Dr Alvester Morin   Goals Addressed             This Visit's Progress    DIET - INCREASE WATER INTAKE   Not on track    Recommend to drink at least 6-8 8oz glasses of water per day.       Depression Screen    04/28/2023    9:14 AM 11/16/2022    9:53 AM 09/14/2022    7:41 AM 04/27/2022    9:44 AM 04/12/2022    7:41 AM 10/13/2021    7:37 AM 09/08/2021    8:16 AM  PHQ 2/9 Scores  PHQ - 2 Score 0 0 0 1 0 0 0  PHQ- 9 Score  0 0 1 0 0 0    Fall Risk    04/28/2023    9:07 AM  11/16/2022    9:53 AM 09/14/2022    7:41 AM 04/12/2022    7:40 AM 10/13/2021    7:37 AM  Fall Risk   Falls in the past year? 0 0 0 0 0  Number falls in past yr: 0 0 0 0 0  Injury with Fall? 0 0 0 0 0  Risk for fall due to : No Fall Risks No Fall Risks No Fall Risks No Fall Risks No Fall Risks  Follow up Education provided;Falls prevention discussed Falls prevention discussed Falls prevention discussed Falls prevention discussed Falls prevention discussed    MEDICARE RISK AT HOME: Medicare Risk at Home Any stairs in or around the home?: Yes If so, are there any without handrails?: Yes Home free of loose throw rugs in walkways, pet beds, electrical cords, etc?: Yes Adequate lighting in your home to reduce risk of falls?: Yes Life alert?: No Use of a cane, walker or w/c?: No Grab bars in the bathroom?: No Shower chair or bench in shower?: No Elevated toilet seat or a handicapped toilet?: Yes  TIMED UP AND GO:  Was the test performed?  Yes  Length of time to ambulate 10 feet: 8 sec Gait steady and fast without use of assistive device    Cognitive Function:        04/28/2023    9:19 AM 04/27/2022    9:45 AM 01/18/2019    9:58 AM 01/13/2018    9:39 AM  6CIT Screen  What Year? 0 points 0 points 0 points  0 points  What month? 0 points 0 points 0 points 0 points  What time? 0 points 0 points 0 points 0 points  Count back from 20 0 points 0 points 0 points 0 points  Months in reverse 0 points 0 points 0 points 0 points  Repeat phrase 0 points 0 points 0 points 6 points  Total Score 0 points 0 points 0 points 6 points    Immunizations Immunization History  Administered Date(s) Administered   Fluad Quad(high Dose 65+) 05/21/2019, 04/22/2020, 04/15/2021   Fluad Trivalent(High Dose 65+) 04/28/2023   Influenza, High Dose Seasonal PF 08/01/2018, 04/27/2022   Influenza, Seasonal, Injecte, Preservative Fre 08/18/2011   Influenza,inj,Quad PF,6+ Mos 04/11/2014, 04/14/2015, 04/15/2016,  05/23/2017   Influenza-Unspecified 04/11/2014   PFIZER(Purple Top)SARS-COV-2 Vaccination 09/26/2019, 10/17/2019, 04/29/2021   Pneumococcal Conjugate-13 01/13/2018   Pneumococcal Polysaccharide-23 01/18/2019   Tdap 04/11/2014   Zoster Recombinant(Shingrix) 04/13/2022   Zoster, Live 04/15/2016    TDAP status: Up to date  Flu Vaccine status: Completed at today's visit  Pneumococcal vaccine status: Up to date  Covid-19 vaccine status: Completed vaccines  Qualifies for Shingles Vaccine? Yes   Zostavax completed Yes   Shingrix Completed?: Yes  Screening Tests Health Maintenance  Topic Date Due   Zoster Vaccines- Shingrix (2 of 2) 06/08/2022   COVID-19 Vaccine (4 - 2023-24 season) 04/17/2023   DTaP/Tdap/Td (2 - Td or Tdap) 04/11/2024   Fecal DNA (Cologuard)  04/21/2024   Medicare Annual Wellness (AWV)  04/27/2024   Pneumonia Vaccine 58+ Years old  Completed   INFLUENZA VACCINE  Completed   Hepatitis C Screening  Completed   HPV VACCINES  Aged Out    Health Maintenance  Health Maintenance Due  Topic Date Due   Zoster Vaccines- Shingrix (2 of 2) 06/08/2022   COVID-19 Vaccine (4 - 2023-24 season) 04/17/2023    Colorectal cancer screening: Type of screening: Cologuard. Completed yes. Repeat every 3 years  Lung Cancer Screening: (Low Dose CT Chest recommended if Age 99-80 years, 20 pack-year currently smoking OR have quit w/in 15years.) does not qualify.   Lung Cancer Screening Referral: no  Additional Screening:  Hepatitis C Screening: does not qualify; Completed yes  Vision Screening: Recommended annual ophthalmology exams for early detection of glaucoma and other disorders of the eye. Is the patient up to date with their annual eye exam?  Yes  Who is the provider or what is the name of the office in which the patient attends annual eye exams? Dr Alvester Morin If pt is not established with a provider, would they like to be referred to a provider to establish care? No .    Dental Screening: Recommended annual dental exams for proper oral hygiene  Diabetic Foot Exam: n/a  Community Resource Referral / Chronic Care Management: CRR required this visit?  No   CCM required this visit?  No     Plan:     I have personally reviewed and noted the following in the patient's chart:   Medical and social history Use of alcohol, tobacco or illicit drugs  Current medications and supplements including opioid prescriptions. Patient is not currently taking opioid prescriptions. Functional ability and status Nutritional status Physical activity Advanced directives List of other physicians Hospitalizations, surgeries, and ER visits in previous 12 months Vitals Screenings to include cognitive, depression, and falls Referrals and appointments  In addition, I have reviewed and discussed with patient certain preventive protocols, quality metrics, and best practice recommendations. A written personalized care plan for  preventive services as well as general preventive health recommendations were provided to patient.     Sue Lush, LPN   04/29/7828   After Visit Summary: (MyChart) Due to this being a telephonic visit, the after visit summary with patients personalized plan was offered to patient via MyChart PRINTED AND GIVEN TO PT  Nurse Notes: The patient states they are doing well and has no concerns or questions at this time.  Flu vaccine given today.

## 2023-05-23 NOTE — Progress Notes (Unsigned)
Name: Lee Harvey   MRN: [id number]    DOB: 11-13-1950   Date:05/24/2023       Progress Note  Subjective  Chief Complaint  Follow Up  HPI  OA right knee: he only takes Tylenol occasionally. Pain is described as aching, sometimes has effusion but no redness . He has been playing disc golf and putt putt with his nephew and pain does not bother him while playing  Stable . Discussed topical medications like Voltaren    Pre-diabetes : polyphagia, polydipsia or polyuria. He gained weight , states drinking sodas again but down to once a day and we will recheck labs today    Hyperlipidemia: taking Atorvastatin and denies side effects of medication, last labs showed LDL at goal at 62 . No myalgia  We will recheck labs today    Obesity: weight is stable over the past 6 months,  he continues to avoid drinking as many sodas    Tremors : both hands worse when nervous, left side worse than right He states his aunt had tremors He denies balance problems or memory changes, or falls. I placed a referral last Summer for him to see a neurologist but he decided not to go, he states tremors worse when he gets aggravated. He is taking Atenolol and seems to improve symptoms a little  Atherosclerosis of Aorta and Aorta ectasy/infrarenal aorta aneurysm without rupture : repeat US in Dec  2024, he is on statin therapy , last LDL at goal. He was seen by Dr. Wyn Quaker that will follow up with him next May for repeat doppler   Senile purpura: usually on arms.  Reassurane given - stable   Patient Active Problem List   Diagnosis Date Noted   AAA (abdominal aortic aneurysm) without rupture (HCC) 12/28/2022   Senile purpura (HCC) 08/27/2020   Dilation of aorta (HCC) 08/06/2018   Atherosclerosis of aorta (HCC) 08/06/2018   Right knee pain 10/20/2016   Hyperlipidemia 04/22/2016   Hyperglycemia 04/15/2016   Difficulty hearing 04/13/2015   Inguinal hernia 04/13/2015   Tremor of both hands 04/13/2015   Excess weight  04/13/2015    Past Surgical History:  Procedure Laterality Date   CATARACT EXTRACTION, BILATERAL     COLONOSCOPY  2015   INGUINAL HERNIA REPAIR Left 11/30/2016   Procedure: HERNIA REPAIR INGUINAL ADULT;  Surgeon: Kieth Brightly, MD;  Location: [facility] ORS;  Service: General;  Laterality: Left;    Family History  Problem Relation Age of Onset   Dementia Mother    Cancer Father 28       Colon   Thyroid disease Sister    Mesothelioma Brother    COPD Brother     Social History   Tobacco Use   Smoking status: Former    Current packs/day: 0.00    Average packs/day: 0.5 packs/day for 20.0 years (10.0 ttl pk-yrs)    Types: Cigarettes    Start date: 08/16/1986    Quit date: 08/16/2006    Years since quitting: 16.7   Smokeless tobacco: Never   Tobacco comments:    smoking cessation materials not required  Substance Use Topics   Alcohol use: No    Alcohol/week: 0.0 standard drinks of alcohol     Current Outpatient Medications:    acetaminophen (TYLENOL) 500 MG tablet, Take 1 tablet (500 mg total) by mouth every 6 (six) hours as needed., Disp: 30 tablet, Rfl: 0   Ascorbic Acid (VITAMIN C) 1000 MG tablet, Take 1,000 mg by mouth  daily., Disp: , Rfl:    aspirin EC 81 MG tablet, Take 1 tablet (81 mg total) by mouth daily., Disp: 30 tablet, Rfl: 0   atenolol (TENORMIN) 25 MG tablet, TAKE 1 TABLET(25 MG) BY MOUTH DAILY, Disp: 90 tablet, Rfl: 0   atorvastatin (LIPITOR) 40 MG tablet, TAKE 1 TABLET(40 MG) BY MOUTH DAILY, Disp: 90 tablet, Rfl: 3   MULTIPLE VITAMIN PO, Take 1 tablet by mouth daily., Disp: , Rfl:   No Known Allergies  I personally reviewed active problem list, medication list, allergies, family history, social history, health maintenance with the patient/caregiver today.   ROS  Ten systems reviewed and is negative except as mentioned in HPI    Objective  Vitals:   05/24/23 0810  BP: 124/76  Pulse: 61  Resp: 16  SpO2: 95%  Weight: 234 lb (106.1 kg)  Height: 5'  10" (1.778 m)    Body mass index is 33.58 kg/m.  Physical Exam  Constitutional: Patient appears well-developed and well-nourished. Obese  No distress.  HEENT: head atraumatic, normocephalic, pupils equal and reactive to light, neck supple, throat within normal limits Cardiovascular: Normal rate, regular rhythm and normal heart sounds.  No murmur heard. No BLE edema. Pulmonary/Chest: Effort normal and breath sounds normal. No respiratory distress. Abdominal: Soft.  There is no tenderness. Psychiatric: Patient has a normal mood and affect. behavior is normal. Judgment and thought content normal.   PHQ2/9:    05/24/2023    8:09 AM 04/28/2023    9:14 AM 11/16/2022    9:53 AM 09/14/2022    7:41 AM 04/27/2022    9:44 AM  Depression screen PHQ 2/9  Decreased Interest 0 0 0 0 1  Down, Depressed, Hopeless 0 0 0 0 0  PHQ - 2 Score 0 0 0 0 1  Altered sleeping 0  0 0 0  Tired, decreased energy 0  0 0 0  Change in appetite 0  0 0 0  Feeling bad or failure about yourself  0  0 0 0  Trouble concentrating 0  0 0 0  Moving slowly or fidgety/restless 0  0 0 0  Suicidal thoughts 0  0 0 0  PHQ-9 Score 0  0 0 1    phq 9 is negative   Fall Risk:    05/24/2023    8:09 AM 04/28/2023    9:07 AM 11/16/2022    9:53 AM 09/14/2022    7:41 AM 04/12/2022    7:40 AM  Fall Risk   Falls in the past year? 0 0 0 0 0  Number falls in past yr: 0 0 0 0 0  Injury with Fall? 0 0 0 0 0  Risk for fall due to : No Fall Risks No Fall Risks No Fall Risks No Fall Risks No Fall Risks  Follow up Falls prevention discussed Education provided;Falls prevention discussed Falls prevention discussed Falls prevention discussed Falls prevention discussed      Functional Status Survey: Is the patient deaf or have difficulty hearing?: Yes Does the patient have difficulty seeing, even when wearing glasses/contacts?: No Does the patient have difficulty concentrating, remembering, or making decisions?: No Does the patient have  difficulty walking or climbing stairs?: No Does the patient have difficulty dressing or bathing?: No Does the patient have difficulty doing errands alone such as visiting a doctor's office or shopping?: No    Assessment & Plan  1. Atherosclerosis of aorta (HCC)  - Lipid panel  2. Infrarenal abdominal aortic  aneurysm (AAA) without rupture (HCC)  Under the care of Dr. Wyn Quaker   3. Pre-diabetes  Recheck A1C  4. Senile purpura (HCC)  Reassurance given   5. Primary osteoarthritis of right knee  Stable  6. Hyperglycemia  - Hemoglobin A1c  7. Long-term use of high-risk medication  - CBC with Differential/Platelet - COMPLETE METABOLIC PANEL WITH GFR  8. Tremor of both hands  - atenolol (TENORMIN) 25 MG tablet; Take 1 tablet (25 mg total) by mouth daily.  Dispense: 90 tablet; Refill: 1

## 2023-05-24 ENCOUNTER — Ambulatory Visit (INDEPENDENT_AMBULATORY_CARE_PROVIDER_SITE_OTHER): Payer: PPO | Admitting: Family Medicine

## 2023-05-24 ENCOUNTER — Encounter: Payer: Self-pay | Admitting: Family Medicine

## 2023-05-24 VITALS — BP 124/76 | HR 61 | Resp 16 | Ht 70.0 in | Wt 234.0 lb

## 2023-05-24 DIAGNOSIS — I7143 Infrarenal abdominal aortic aneurysm, without rupture: Secondary | ICD-10-CM | POA: Diagnosis not present

## 2023-05-24 DIAGNOSIS — R7303 Prediabetes: Secondary | ICD-10-CM

## 2023-05-24 DIAGNOSIS — R251 Tremor, unspecified: Secondary | ICD-10-CM | POA: Diagnosis not present

## 2023-05-24 DIAGNOSIS — I700 Atherosclerosis of aorta: Secondary | ICD-10-CM

## 2023-05-24 DIAGNOSIS — D692 Other nonthrombocytopenic purpura: Secondary | ICD-10-CM | POA: Diagnosis not present

## 2023-05-24 DIAGNOSIS — M1711 Unilateral primary osteoarthritis, right knee: Secondary | ICD-10-CM

## 2023-05-24 DIAGNOSIS — Z79899 Other long term (current) drug therapy: Secondary | ICD-10-CM

## 2023-05-24 DIAGNOSIS — R739 Hyperglycemia, unspecified: Secondary | ICD-10-CM | POA: Diagnosis not present

## 2023-05-24 MED ORDER — ATENOLOL 25 MG PO TABS
25.0000 mg | ORAL_TABLET | Freq: Every day | ORAL | 1 refills | Status: DC
Start: 2023-05-24 — End: 2023-11-22

## 2023-05-25 LAB — HEMOGLOBIN A1C
Hgb A1c MFr Bld: 6.2 %{Hb} — ABNORMAL HIGH (ref <5.7)
Mean Plasma Glucose: 131 mg/dL
eAG (mmol/L): 7.3 mmol/L

## 2023-05-25 LAB — LIPID PANEL
Cholesterol: 136 mg/dL (ref <200)
HDL: 47 mg/dL (ref >=40)
LDL Cholesterol (Calc): 73 mg/dL
Non-HDL Cholesterol (Calc): 89 mg/dL (ref <130)
Total CHOL/HDL Ratio: 2.9 (calc) (ref <5.0)
Triglycerides: 76 mg/dL (ref <150)

## 2023-05-25 LAB — CBC WITH DIFFERENTIAL/PLATELET
Absolute Monocytes: 759 {cells}/uL (ref 200–950)
Basophils Absolute: 73 {cells}/uL (ref 0–200)
Basophils Relative: 1 %
Eosinophils Absolute: 234 {cells}/uL (ref 15–500)
Eosinophils Relative: 3.2 %
HCT: 45.6 % (ref 38.5–50.0)
Hemoglobin: 14.7 g/dL (ref 13.2–17.1)
Lymphs Abs: 1891 {cells}/uL (ref 850–3900)
MCH: 31.3 pg (ref 27.0–33.0)
MCHC: 32.2 g/dL (ref 32.0–36.0)
MCV: 97 fL (ref 80.0–100.0)
MPV: 10.9 fL (ref 7.5–12.5)
Monocytes Relative: 10.4 %
Neutro Abs: 4344 {cells}/uL (ref 1500–7800)
Neutrophils Relative %: 59.5 %
Platelets: 176 10*3/uL (ref 140–400)
RBC: 4.7 Million/uL (ref 4.20–5.80)
RDW: 12.3 % (ref 11.0–15.0)
Total Lymphocyte: 25.9 %
WBC: 7.3 10*3/uL (ref 3.8–10.8)

## 2023-05-25 LAB — COMPLETE METABOLIC PANEL WITH GFR
AG Ratio: 2 (calc) (ref 1.0–2.5)
ALT: 22 U/L (ref 9–46)
AST: 19 U/L (ref 10–35)
Albumin: 4.5 g/dL (ref 3.6–5.1)
Alkaline phosphatase (APISO): 124 U/L (ref 35–144)
BUN: 17 mg/dL (ref 7–25)
CO2: 28 mmol/L (ref 20–32)
Calcium: 9.4 mg/dL (ref 8.6–10.3)
Chloride: 105 mmol/L (ref 98–110)
Creat: 1.22 mg/dL (ref 0.70–1.28)
Globulin: 2.3 g/dL (ref 1.9–3.7)
Glucose, Bld: 105 mg/dL — ABNORMAL HIGH (ref 65–99)
Potassium: 4.7 mmol/L (ref 3.5–5.3)
Sodium: 142 mmol/L (ref 135–146)
Total Bilirubin: 1 mg/dL (ref 0.2–1.2)
Total Protein: 6.8 g/dL (ref 6.1–8.1)
eGFR: 64 mL/min/{1.73_m2} (ref >=60)

## 2023-08-25 ENCOUNTER — Other Ambulatory Visit: Payer: Self-pay | Admitting: Family Medicine

## 2023-08-25 DIAGNOSIS — E7800 Pure hypercholesterolemia, unspecified: Secondary | ICD-10-CM

## 2023-08-25 DIAGNOSIS — I700 Atherosclerosis of aorta: Secondary | ICD-10-CM

## 2023-11-22 ENCOUNTER — Encounter: Payer: Self-pay | Admitting: Family Medicine

## 2023-11-22 ENCOUNTER — Ambulatory Visit: Payer: Self-pay | Admitting: Family Medicine

## 2023-11-22 VITALS — BP 122/68 | HR 51 | Resp 16 | Ht 70.0 in | Wt 244.9 lb

## 2023-11-22 DIAGNOSIS — I700 Atherosclerosis of aorta: Secondary | ICD-10-CM | POA: Diagnosis not present

## 2023-11-22 DIAGNOSIS — M1711 Unilateral primary osteoarthritis, right knee: Secondary | ICD-10-CM | POA: Diagnosis not present

## 2023-11-22 DIAGNOSIS — I77819 Aortic ectasia, unspecified site: Secondary | ICD-10-CM

## 2023-11-22 DIAGNOSIS — R251 Tremor, unspecified: Secondary | ICD-10-CM | POA: Diagnosis not present

## 2023-11-22 DIAGNOSIS — E7800 Pure hypercholesterolemia, unspecified: Secondary | ICD-10-CM

## 2023-11-22 DIAGNOSIS — R7303 Prediabetes: Secondary | ICD-10-CM

## 2023-11-22 DIAGNOSIS — I7143 Infrarenal abdominal aortic aneurysm, without rupture: Secondary | ICD-10-CM

## 2023-11-22 MED ORDER — ATENOLOL 25 MG PO TABS
25.0000 mg | ORAL_TABLET | Freq: Every day | ORAL | 1 refills | Status: DC
Start: 2023-11-22 — End: 2024-05-23

## 2023-11-22 NOTE — Patient Instructions (Signed)
 Team Member Role and Visual merchandiser Info Address Start End Comments  Dew, Marlow Baars, MD Referring Physician (Vascular Surgery) Phone: [phone] Fax: [phone] Email: [email or web address] [address] [place] [address] 11/22/2023 - -

## 2023-11-22 NOTE — Progress Notes (Signed)
 Name: Lee Harvey   MRN: [id number]    DOB: March 29, 1951   Date:11/22/2023       Progress Note  Subjective  Chief Complaint  Chief Complaint  Patient presents with   Medical Management of Chronic Issues   Discussed the use of AI scribe software for clinical note transcription with the patient, who gave verbal consent to proceed.  History of Present Illness Lee Harvey is a 73 year old male with hypertension, high cholesterol, and tremors who presents for a six-month follow-up.  He has gained approximately ten pounds since his last visit, which he attributes to decreased physical activity and increased intake of sweets. His BMI is over 35.  He is currently taking atenolol, which helps manage his tremors, particularly in both hands with the left being worse than the right. No balance or memory issues. He was previously referred to a neurologist for his tremors but did not attend the appointment.  He has a history of atherosclerosis, aorta Ectasy  and an infrarenal aortic aneurysm without rupture. He is scheduled for a follow-up ultrasound next month but plans to reschedule due to family commitments. He takes atorvastatin and aspirin for his high cholesterol and plaque formation and reports no issues with these medications.  He has prediabetes and has been working on reducing his intake of sodas and increasing water consumption. His last A1c in October was 6.2. No increased hunger, thirst, or frequent urination.  He experiences occasional pain in his right knee due to osteoarthritis, particularly after prolonged standing or walking. He describes the pain as an ache and sometimes feels like the knee might give way. He manages the pain with Tylenol for arthritis.   Patient Active Problem List   Diagnosis Date Noted   AAA (abdominal aortic aneurysm) without rupture (HCC) 12/28/2022   Senile purpura (HCC) 08/27/2020   Dilation of aorta (HCC) 08/06/2018   Atherosclerosis of aorta (HCC)  08/06/2018   Right knee pain 10/20/2016   Hyperlipidemia 04/22/2016   Hyperglycemia 04/15/2016   Difficulty hearing 04/13/2015   Inguinal hernia 04/13/2015   Tremor of both hands 04/13/2015   Excess weight 04/13/2015    Past Surgical History:  Procedure Laterality Date   CATARACT EXTRACTION, BILATERAL     COLONOSCOPY  2015   INGUINAL HERNIA REPAIR Left 11/30/2016   Procedure: HERNIA REPAIR INGUINAL ADULT;  Surgeon: Kieth Brightly, MD;  Location: [facility] ORS;  Service: General;  Laterality: Left;    Family History  Problem Relation Age of Onset   Dementia Mother    Cancer Father 40       Colon   Thyroid disease Sister    Mesothelioma Brother    COPD Brother     Social History   Tobacco Use   Smoking status: Former    Current packs/day: 0.00    Average packs/day: 0.5 packs/day for 20.0 years (10.0 ttl pk-yrs)    Types: Cigarettes    Start date: 08/16/1986    Quit date: 08/16/2006    Years since quitting: 17.2   Smokeless tobacco: Never   Tobacco comments:    smoking cessation materials not required  Substance Use Topics   Alcohol use: No    Alcohol/week: 0.0 standard drinks of alcohol     Current Outpatient Medications:    acetaminophen (TYLENOL) 500 MG tablet, Take 1 tablet (500 mg total) by mouth every 6 (six) hours as needed., Disp: 30 tablet, Rfl: 0   Ascorbic Acid (VITAMIN C) 1000 MG tablet, Take  1,000 mg by mouth daily., Disp: , Rfl:    aspirin EC 81 MG tablet, Take 1 tablet (81 mg total) by mouth daily., Disp: 30 tablet, Rfl: 0   atenolol (TENORMIN) 25 MG tablet, Take 1 tablet (25 mg total) by mouth daily., Disp: 90 tablet, Rfl: 1   atorvastatin (LIPITOR) 40 MG tablet, TAKE 1 TABLET(40 MG) BY MOUTH DAILY, Disp: 90 tablet, Rfl: 3   MULTIPLE VITAMIN PO, Take 1 tablet by mouth daily., Disp: , Rfl:   No Known Allergies  I personally reviewed active problem list, medication list, allergies with the patient/caregiver today.   ROS  Ten systems reviewed and  is negative except as mentioned in HPI    Objective Physical Exam VITALS: P- 51 MEASUREMENTS: BMI- 35.0. CONSTITUTIONAL: Patient appears well-developed and well-nourished. No distress. HEENT: Head atraumatic, normocephalic, neck supple. CARDIOVASCULAR: Normal rate, regular rhythm and normal heart sounds. No murmur heard. No BLE edema. PULMONARY: Effort normal and breath sounds normal. Lungs clear to auscultation. No respiratory distress. ABDOMINAL: There is no tenderness or distention. Neuro: fine tremors worse at rest  MUSCULOSKELETAL: Normal gait. Without gross motor or sensory deficit.got up quickly from chair  PSYCHIATRIC: Patient has a normal mood and affect. Behavior is normal. Judgment and thought content normal.  Vitals:   11/22/23 0820  BP: 122/68  Pulse: (!) 51  Resp: 16  SpO2: 96%  Weight: 244 lb 14.4 oz (111.1 kg)  Height: 5\' 10"  (1.778 m)    Body mass index is 35.14 kg/m.    PHQ2/9:    11/22/2023    8:16 AM 05/24/2023    8:09 AM 04/28/2023    9:14 AM 11/16/2022    9:53 AM 09/14/2022    7:41 AM  Depression screen PHQ 2/9  Decreased Interest 0 0 0 0 0  Down, Depressed, Hopeless 0 0 0 0 0  PHQ - 2 Score 0 0 0 0 0  Altered sleeping 0 0  0 0  Tired, decreased energy 0 0  0 0  Change in appetite 0 0  0 0  Feeling bad or failure about yourself  0 0  0 0  Trouble concentrating 0 0  0 0  Moving slowly or fidgety/restless 0 0  0 0  Suicidal thoughts 0 0  0 0  PHQ-9 Score 0 0  0 0  Difficult doing work/chores Not difficult at all        phq 9 is negative  Fall Risk:    11/22/2023    8:16 AM 05/24/2023    8:09 AM 04/28/2023    9:07 AM 11/16/2022    9:53 AM 09/14/2022    7:41 AM  Fall Risk   Falls in the past year? 0 0 0 0 0  Number falls in past yr: 0 0 0 0 0  Injury with Fall? 0 0 0 0 0  Risk for fall due to : No Fall Risks No Fall Risks No Fall Risks No Fall Risks No Fall Risks  Follow up Falls prevention discussed;Education provided;Falls evaluation completed  Falls prevention discussed Education provided;Falls prevention discussed Falls prevention discussed Falls prevention discussed      Assessment & Plan Morbid Obesity BMI over 35, morbidly obese. Weight gain likely from decreased activity and increased sweets. Compounded by hyperlipidemia and hypertension. - Encouraged reduction of sweets in diet. - Advised increase in physical activity, specifically walking.  Prediabetes A1c 6.2, indicating prediabetes. Reduced soda intake, increased water. Craving sweets may relate to prediabetes. - Advised  dietary changes to reduce sweets and improve glycemic control. - Plan to check blood glucose and A1c at next lab work.  Hypertension On atenolol for hypertension and tremors. Low heart rate but continued atenolol for tremor control. On atorvastatin and aspirin for hyperlipidemia and plaque formation. - Continue atenolol. - Continue atorvastatin. - Continue aspirin. - Send refills for medications.  Hyperlipidemia/atherosclerosis of aorta -taking aspirin 81 mg - continue atorvastatin   Essential Tremor Tremors in both hands, left worse. Atenolol helps. Family history suggests essential tremor - Offer referral to neurologist if he decides to pursue further treatment.  Abdominal Aortic Aneurysm.Aorta Ectasy Known infrarenal aneurysm without rupture. Follow-up ultrasound scheduled for May, needs rescheduling. - Provide contact information for rescheduling ultrasound with vascular specialist.  Osteoarthritis of the Right Knee Occasional aching in right knee, managed with acetaminophen. - Continue acetaminophen for pain management.

## 2023-12-27 ENCOUNTER — Ambulatory Visit (INDEPENDENT_AMBULATORY_CARE_PROVIDER_SITE_OTHER): Payer: PPO | Admitting: Vascular Surgery

## 2023-12-27 ENCOUNTER — Other Ambulatory Visit (INDEPENDENT_AMBULATORY_CARE_PROVIDER_SITE_OTHER): Payer: PPO

## 2024-01-03 ENCOUNTER — Encounter (INDEPENDENT_AMBULATORY_CARE_PROVIDER_SITE_OTHER): Payer: Self-pay

## 2024-01-26 ENCOUNTER — Other Ambulatory Visit (INDEPENDENT_AMBULATORY_CARE_PROVIDER_SITE_OTHER): Payer: Self-pay | Admitting: Vascular Surgery

## 2024-01-26 DIAGNOSIS — I714 Abdominal aortic aneurysm, without rupture, unspecified: Secondary | ICD-10-CM

## 2024-01-27 ENCOUNTER — Encounter (INDEPENDENT_AMBULATORY_CARE_PROVIDER_SITE_OTHER): Payer: Self-pay | Admitting: Vascular Surgery

## 2024-01-27 ENCOUNTER — Ambulatory Visit (INDEPENDENT_AMBULATORY_CARE_PROVIDER_SITE_OTHER): Admitting: Vascular Surgery

## 2024-01-27 ENCOUNTER — Ambulatory Visit (INDEPENDENT_AMBULATORY_CARE_PROVIDER_SITE_OTHER)

## 2024-01-27 VITALS — BP 136/83 | HR 55 | Ht 70.0 in | Wt 232.1 lb

## 2024-01-27 DIAGNOSIS — I7143 Infrarenal abdominal aortic aneurysm, without rupture: Secondary | ICD-10-CM

## 2024-01-27 DIAGNOSIS — I714 Abdominal aortic aneurysm, without rupture, unspecified: Secondary | ICD-10-CM | POA: Diagnosis not present

## 2024-01-27 DIAGNOSIS — E7800 Pure hypercholesterolemia, unspecified: Secondary | ICD-10-CM | POA: Diagnosis not present

## 2024-01-27 NOTE — Assessment & Plan Note (Signed)
 lipid control important in reducing the progression of atherosclerotic disease. Continue statin therapy

## 2024-01-27 NOTE — Progress Notes (Signed)
 MRN : [id number]  Lee Harvey is a 73 y.o. (Dec 20, 1950) male who presents with chief complaint of  Chief Complaint  Patient presents with   1 year AAA + see jd  .  History of Present Illness: Patient returns today in follow up of his aneurysm.  He is doing well today.  He denies any aneurysm related symptoms. Specifically, the patient denies new back or abdominal pain, or signs of peripheral embolization.  His aortic duplex today shows a maximal diameter of the aorta of approximately 2.9 cm.  This is slightly smaller than 3.1 cm seen at his previous study last year.  Current Outpatient Medications  Medication Sig Dispense Refill   acetaminophen  (TYLENOL ) 500 MG tablet Take 1 tablet (500 mg total) by mouth every 6 (six) hours as needed. 30 tablet 0   Ascorbic Acid (VITAMIN C) 1000 MG tablet Take 1,000 mg by mouth daily.     aspirin  EC 81 MG tablet Take 1 tablet (81 mg total) by mouth daily. 30 tablet 0   atenolol  (TENORMIN ) 25 MG tablet Take 1 tablet (25 mg total) by mouth daily. 90 tablet 1   atorvastatin  (LIPITOR) 40 MG tablet TAKE 1 TABLET(40 MG) BY MOUTH DAILY 90 tablet 3   MULTIPLE VITAMIN PO Take 1 tablet by mouth daily.     No current facility-administered medications for this visit.    Past Medical History:  Diagnosis Date   Arthritis    KNEE RIGHT   GERD (gastroesophageal reflux disease)    OCC   History of hemorrhoids    HL (hearing loss)    Hyperglycemia    Hyperlipidemia    Left inguinal hernia    Tremors of nervous system    Trigger finger     Past Surgical History:  Procedure Laterality Date   CATARACT EXTRACTION, BILATERAL     COLONOSCOPY  2015   INGUINAL HERNIA REPAIR Left 11/30/2016   Procedure: HERNIA REPAIR INGUINAL ADULT;  Surgeon: Jerlean Mood, MD;  Location: [facility] OR[facility];  Service: General;  Laterality: Left;     Social History   Tobacco Use   Smoking status: Former    Current packs/day: 0.00    Average packs/day: 0.5 packs/day for  20.0 years (10.0 ttl pk-yrs)    Types: Cigarettes    Start date: 08/16/1986    Quit date: 08/16/2006    Years since quitting: 17.4   Smokeless tobacco: Never   Tobacco comments:    smoking cessation materials not required  Vaping Use   Vaping status: Never Used  Substance Use Topics   Alcohol use: No    Alcohol/week: 0.0 standard drinks of alcohol   Drug use: No       Family History  Problem Relation Age of Onset   Dementia Mother    Cancer Father 14       Colon   Thyroid disease Sister    Mesothelioma Brother    COPD Brother      No Known Allergies   REVIEW OF SYSTEMS (Negative unless checked)  Constitutional: [] Weight loss  [] Fever  [] Chills Cardiac: [] Chest pain   [] Chest pressure   [] Palpitations   [] Shortness of breath when laying flat   [] Shortness of breath at rest   [] Shortness of breath with exertion. Vascular:  [] Pain in legs with walking   [] Pain in legs at rest   [] Pain in legs when laying flat   [] Claudication   [] Pain in feet when walking  [] Pain in feet  at rest  [] Pain in feet when laying flat   [] History of DVT   [] Phlebitis   [] Swelling in legs   [] Varicose veins   [] Non-healing ulcers Pulmonary:   [] Uses home oxygen   [] Productive cough   [] Hemoptysis   [] Wheeze  [] COPD   [] Asthma Neurologic:  [] Dizziness  [] Blackouts   [] Seizures   [] History of stroke   [] History of TIA  [] Aphasia   [] Temporary blindness   [] Dysphagia   [] Weakness or numbness in arms   [] Weakness or numbness in legs Musculoskeletal:  [x] Arthritis   [] Joint swelling   [x] Joint pain   [] Low back pain Hematologic:  [] Easy bruising  [] Easy bleeding   [] Hypercoagulable state   [] Anemic   Gastrointestinal:  [] Blood in stool   [] Vomiting blood  [x] Gastroesophageal reflux/heartburn   [] Abdominal pain Genitourinary:  [] Chronic kidney disease   [] Difficult urination  [] Frequent urination  [] Burning with urination   [] Hematuria Skin:  [] Rashes   [] Ulcers   [] Wounds Psychological:  [] History of anxiety    []  History of major depression.  Physical Examination  BP 136/83   Pulse (!) 55   Ht 5' 10 (1.778 m)   Wt 232 lb 2 oz (105.3 kg)   BMI 33.31 kg/m  Gen:  WD/WN, NAD Head: [place]/AT, No temporalis wasting. Ear/Nose/Throat: Hearing grossly intact, nares w/o erythema or drainage Eyes: Conjunctiva clear. Sclera non-icteric Neck: Supple.  Trachea midline Pulmonary:  Good air movement, no use of accessory muscles.  Cardiac: RRR, no JVD Vascular:  Vessel Right Left  Radial Palpable Palpable                                   Gastrointestinal: soft, non-tender/non-distended. No guarding/reflex. No increased aortic impulse Musculoskeletal: M/S 5/5 throughout.  No deformity or atrophy. No edema. Neurologic: Sensation grossly intact in extremities.  Symmetrical.  Speech is fluent.  Psychiatric: Judgment intact, Mood & affect appropriate for pt's clinical situation. Dermatologic: No rashes or ulcers noted.  No cellulitis or open wounds.      Labs No results found for this or any previous visit (from the past 2160 hours).  Radiology No results found.  Assessment/Plan  Hyperlipidemia lipid control important in reducing the progression of atherosclerotic disease. Continue statin therapy   AAA (abdominal aortic aneurysm) without rupture (HCC) His aortic duplex today shows a maximal diameter of the aorta of approximately 2.9 cm.  This is slightly smaller than 3.1 cm seen at his previous study last year. Continue to monitor on an annual basis.  Blood pressure control and remaining abstinent from tobacco are important to avoid aneurysmal growth.    Mikki Alexander, MD  01/27/2024 2:37 PM    This note was created with Dragon medical transcription system.  Any errors from dictation are purely unintentional

## 2024-01-27 NOTE — Assessment & Plan Note (Signed)
 His aortic duplex today shows a maximal diameter of the aorta of approximately 2.9 cm.  This is slightly smaller than 3.1 cm seen at his previous study last year. Continue to monitor on an annual basis.  Blood pressure control and remaining abstinent from tobacco are important to avoid aneurysmal growth.

## 2024-03-08 ENCOUNTER — Encounter: Payer: Self-pay | Admitting: Family Medicine

## 2024-03-08 ENCOUNTER — Ambulatory Visit (INDEPENDENT_AMBULATORY_CARE_PROVIDER_SITE_OTHER): Admitting: Family Medicine

## 2024-03-08 VITALS — BP 122/68 | HR 61 | Resp 16 | Ht 70.0 in | Wt 229.0 lb

## 2024-03-08 DIAGNOSIS — Z1211 Encounter for screening for malignant neoplasm of colon: Secondary | ICD-10-CM | POA: Diagnosis not present

## 2024-03-08 DIAGNOSIS — Z0000 Encounter for general adult medical examination without abnormal findings: Secondary | ICD-10-CM

## 2024-03-08 DIAGNOSIS — Z23 Encounter for immunization: Secondary | ICD-10-CM

## 2024-03-08 NOTE — Progress Notes (Signed)
 Name: Lee Harvey   MRN: [id number]    DOB: 15-Aug-1951   Date:03/08/2024       Progress Note  Subjective  Chief Complaint  Chief Complaint  Patient presents with   Annual Exam      Patient presents for annual CPE .   IPSS     Row Name 03/08/24 0915         International Prostate Symptom Score   How often have you had the sensation of not emptying your bladder? Not at All     How often have you had to urinate less than every two hours? Not at All     How often have you found you stopped and started again several times when you urinated? Not at All     How often have you found it difficult to postpone urination? Not at All     How often have you had a weak urinary stream? Not at All     How often have you had to strain to start urination? Not at All     How many times did you typically get up at night to urinate? 1 Time     Total IPSS Score 1        Diet: cutting down on biscuits, light lunch and dinner at a restaurant - he tries to add vegetables  Exercise: continue regular physical activity  Last Dental Exam: up to date  Last Eye Exam: up to date   Depression: phq 9 is negative    03/08/2024    9:00 AM 11/22/2023    8:16 AM 05/24/2023    8:09 AM 04/28/2023    9:14 AM 11/16/2022    9:53 AM  Depression screen PHQ 2/9  Decreased Interest 0 0 0 0 0  Down, Depressed, Hopeless 0 0 0 0 0  PHQ - 2 Score 0 0 0 0 0  Altered sleeping  0 0  0  Tired, decreased energy  0 0  0  Change in appetite  0 0  0  Feeling bad or failure about yourself   0 0  0  Trouble concentrating  0 0  0  Moving slowly or fidgety/restless  0 0  0  Suicidal thoughts  0 0  0  PHQ-9 Score  0 0  0  Difficult doing work/chores  Not difficult at all       Hypertension:  BP Readings from Last 3 Encounters:  03/08/24 122/68  01/27/24 136/83  11/22/23 122/68    Obesity: Wt Readings from Last 3 Encounters:  03/08/24 229 lb (103.9 kg)  01/27/24 232 lb 2 oz (105.3 kg)  11/22/23 244 lb 14.4 oz (111.1  kg)   BMI Readings from Last 3 Encounters:  03/08/24 32.86 kg/m  01/27/24 33.31 kg/m  11/22/23 35.14 kg/m     Flowsheet Row Office Visit from 03/08/2024 in [facility]  AUDIT-C Score 0     Single STD testing and prevention (HIV/chl/gon/syphilis):  not applicable Sexual history: not sexually active Hep C Screening: completed Skin cancer: Discussed monitoring for atypical lesions Colorectal cancer: due for repeat in September  Prostate cancer:  not applicable Lab Results  Component Value Date   PSA 0.94 08/27/2020   PSA 0.9 05/23/2017   PSA 0.9 04/15/2016     Lung cancer:  Low Dose CT Chest recommended if Age 66-80 years, 30 pack-year currently smoking OR have quit w/in 15years. Patient  is not a candidate for screening  AAA: done  ECG:  2018  Vaccines: reviewed with the patient.   Advanced Care Planning: A voluntary discussion about advance care planning including the explanation and discussion of advance directives.  Discussed health care proxy and Living will, and the patient was able to identify a health care proxy as sister .  Patient does not have a living will and power of attorney of health care   Patient Active Problem List   Diagnosis Date Noted   Morbid obesity (HCC) 11/22/2023   Primary osteoarthritis of right knee 11/22/2023   Pre-diabetes 11/22/2023   AAA (abdominal aortic aneurysm) without rupture (HCC) 12/28/2022   Senile purpura (HCC) 08/27/2020   Ectatic aorta (HCC) 08/06/2018   Atherosclerosis of aorta (HCC) 08/06/2018   Hyperlipidemia 04/22/2016   Hyperglycemia 04/15/2016   Difficulty hearing 04/13/2015   Inguinal hernia 04/13/2015   Tremor of both hands 04/13/2015    Past Surgical History:  Procedure Laterality Date   CATARACT EXTRACTION, BILATERAL     COLONOSCOPY  2015   INGUINAL HERNIA REPAIR Left 11/30/2016   Procedure: HERNIA REPAIR INGUINAL ADULT;  Surgeon: Louanne KANDICE Muse, MD;  Location: [facility] ORS;   Service: General;  Laterality: Left;    Family History  Problem Relation Age of Onset   Dementia Mother    Cancer Father 64       Colon   Thyroid disease Sister    Mesothelioma Brother    COPD Brother     Social History   Socioeconomic History   Marital status: Single    Spouse name: Not on file   Number of children: 0   Years of education: Not on file   Highest education level: 12th grade  Occupational History   Occupation: Retired  Tobacco Use   Smoking status: Former    Current packs/day: 0.00    Average packs/day: 0.5 packs/day for 20.0 years (10.0 ttl pk-yrs)    Types: Cigarettes    Start date: 08/16/1986    Quit date: 08/16/2006    Years since quitting: 17.5   Smokeless tobacco: Never   Tobacco comments:    smoking cessation materials not required  Vaping Use   Vaping status: Never Used  Substance and Sexual Activity   Alcohol use: No    Alcohol/week: 0.0 standard drinks of alcohol   Drug use: No   Sexual activity: Not Currently    Partners: Female  Other Topics Concern   Not on file  Social History Narrative   He lives alone, brother died 2018/03/06  He was married twice but never had children   He is very close to his sister, but she lives in [place]   He sees his nephew on a regular basis.    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   Has a friend that he eats breakfast with every morning, also plays disc golf a few times a week with another friend   Social Drivers of Corporate investment banker Strain: Low Risk  (03/08/2024)   Overall Financial Resource Strain (CARDIA)    Difficulty of Paying Living Expenses: Not hard at all  Food Insecurity: No Food Insecurity (03/08/2024)   Hunger Vital Sign    Worried About Running Out of Food in the Last Year: Never true    Ran Out of Food in the Last Year: Never true  Transportation Needs: No Transportation Needs (03/08/2024)   PRAPARE - Transportation    Lack of Transportation (Medical): No    Lack  of  Transportation (Non-Medical): No  Physical Activity: Sufficiently Active (03/08/2024)   Exercise Vital Sign    Days of Exercise per Week: 4 days    Minutes of Exercise per Session: 50 min  Stress: No Stress Concern Present (03/08/2024)   [employer] of Occupational Health - Occupational Stress Questionnaire    Feeling of Stress: Not at all  Social Connections: Moderately Integrated (03/08/2024)   Social Connection and Isolation Panel    Frequency of Communication with Friends and Family: More than three times a week    Frequency of Social Gatherings with Friends and Family: More than three times a week    Attends Religious Services: 1 to 4 times per year    Active Member of [employer] or Organizations: Yes    Attends Banker Meetings: 1 to 4 times per year    Marital Status: Divorced  Intimate Partner Violence: Not At Risk (03/08/2024)   Humiliation, Afraid, Rape, and Kick questionnaire    Fear of Current or Ex-Partner: No    Emotionally Abused: No    Physically Abused: No    Sexually Abused: No     Current Outpatient Medications:    acetaminophen  (TYLENOL ) 500 MG tablet, Take 1 tablet (500 mg total) by mouth every 6 (six) hours as needed., Disp: 30 tablet, Rfl: 0   Ascorbic Acid (VITAMIN C) 1000 MG tablet, Take 1,000 mg by mouth daily., Disp: , Rfl:    aspirin  EC 81 MG tablet, Take 1 tablet (81 mg total) by mouth daily., Disp: 30 tablet, Rfl: 0   atenolol  (TENORMIN ) 25 MG tablet, Take 1 tablet (25 mg total) by mouth daily., Disp: 90 tablet, Rfl: 1   atorvastatin  (LIPITOR) 40 MG tablet, TAKE 1 TABLET(40 MG) BY MOUTH DAILY, Disp: 90 tablet, Rfl: 3   MULTIPLE VITAMIN PO, Take 1 tablet by mouth daily., Disp: , Rfl:   No Known Allergies   ROS  Constitutional: Negative for fever or weight change.  Respiratory: Negative for cough and shortness of breath.   Cardiovascular: Negative for chest pain or palpitations.  Gastrointestinal: Negative for abdominal pain, no bowel  changes.  Musculoskeletal: Negative for gait problem or joint swelling.  Skin: Negative for rash.  Neurological: Negative for dizziness or headache.  No other specific complaints in a complete review of systems (except as listed in HPI above).    Objective  Vitals:   03/08/24 0913  BP: 122/68  Pulse: 61  Resp: 16  SpO2: 95%  Weight: 229 lb (103.9 kg)  Height: 5' 10 (1.778 m)    Body mass index is 32.86 kg/m.  Physical Exam  Constitutional: Patient appears well-developed and well-nourished. No distress.  HENT: Head: Normocephalic and atraumatic. Ears: B TMs ok, no erythema or effusion; Nose: Nose normal. Mouth/Throat: Oropharynx is clear and moist. No oropharyngeal exudate.  Eyes: Conjunctivae and EOM are normal. Pupils are equal, round, and reactive to light. No scleral icterus.  Neck: Normal range of motion. Neck supple. No JVD present. No thyromegaly present.  Cardiovascular: Normal rate, regular rhythm and normal heart sounds.  No murmur heard. No BLE edema. Pulmonary/Chest: Effort normal and breath sounds normal. No respiratory distress. Abdominal: Soft. Bowel sounds are normal, no distension. There is no tenderness. no masses MALE GENITALIA: Normal descended testes bilaterally, no masses palpated, no hernias, no lesions, no discharge RECTAL: not done  Musculoskeletal: Normal range of motion, no joint effusions. No gross deformities Neurological: he is alert and oriented to person, place, and time. No  cranial nerve deficit. Coordination, balance, strength, speech and gait are normal.  Skin: Skin is warm and dry. No rash noted. No erythema.  Psychiatric: Patient has a normal mood and affect. behavior is normal. Judgment and thought content normal.     Assessment & Plan   1. Well adult exam (Primary)   2. Colon cancer screening  - Cologuard   3. Need for Tdap vaccination  Get at local pharmacy    -Prostate cancer screening and PSA options (with potential risks  and benefits of testing vs not testing) were discussed along with recent recs/guidelines. -USPSTF grade A and B recommendations reviewed with patient; age-appropriate recommendations, preventive care, screening tests, etc discussed and encouraged; healthy living encouraged; see AVS for patient education given to patient -Discussed importance of 150 minutes of physical activity weekly, eat two servings of fish weekly, eat one serving of tree nuts ( cashews, pistachios, pecans, almonds.SABRA) every other day, eat 6 servings of fruit/vegetables daily and drink plenty of water and avoid sweet beverages.  -Reviewed Health Maintenance: yes

## 2024-03-08 NOTE — Patient Instructions (Signed)
Get Tdap at local pharmacy

## 2024-03-08 NOTE — Progress Notes (Deleted)
 Name: Lee Harvey   MRN: [id number]    DOB: 03-Jun-1951   Date:03/08/2024       Progress Note  Subjective  Chief Complaint  Chief Complaint  Patient presents with   Annual Exam    HPI  Patient presents for annual CPE .   IPSS     Row Name 03/08/24 0915         International Prostate Symptom Score   How often have you had the sensation of not emptying your bladder? Not at All     How often have you had to urinate less than every two hours? Not at All     How often have you found you stopped and started again several times when you urinated? Not at All     How often have you found it difficult to postpone urination? Not at All     How often have you had a weak urinary stream? Not at All     How often have you had to strain to start urination? Not at All     How many times did you typically get up at night to urinate? 1 Time     Total IPSS Score 1        Diet: cutting down on biscuits, light lunch and dinner at a restaurant - he tries to add vegetables  Exercise: continue regular physical activity  Last Dental Exam: up to date  Last Eye Exam: up to date   Depression: phq 9 is negative    03/08/2024    9:00 AM 11/22/2023    8:16 AM 05/24/2023    8:09 AM 04/28/2023    9:14 AM 11/16/2022    9:53 AM  Depression screen PHQ 2/9  Decreased Interest 0 0 0 0 0  Down, Depressed, Hopeless 0 0 0 0 0  PHQ - 2 Score 0 0 0 0 0  Altered sleeping  0 0  0  Tired, decreased energy  0 0  0  Change in appetite  0 0  0  Feeling bad or failure about yourself   0 0  0  Trouble concentrating  0 0  0  Moving slowly or fidgety/restless  0 0  0  Suicidal thoughts  0 0  0  PHQ-9 Score  0 0  0  Difficult doing work/chores  Not difficult at all       Hypertension:  BP Readings from Last 3 Encounters:  03/08/24 122/68  01/27/24 136/83  11/22/23 122/68    Obesity: Wt Readings from Last 3 Encounters:  03/08/24 229 lb (103.9 kg)  01/27/24 232 lb 2 oz (105.3 kg)  11/22/23 244 lb 14.4 oz  (111.1 kg)   BMI Readings from Last 3 Encounters:  03/08/24 32.86 kg/m  01/27/24 33.31 kg/m  11/22/23 35.14 kg/m     Flowsheet Row Office Visit from 03/08/2024 in [facility]  AUDIT-C Score 0     Single STD testing and prevention (HIV/chl/gon/syphilis):  not applicable Sexual history: not sexually active Hep C Screening: completed Skin cancer: Discussed monitoring for atypical lesions Colorectal cancer: due for repeat in September  Prostate cancer:  not applicable Lab Results  Component Value Date   PSA 0.94 08/27/2020   PSA 0.9 05/23/2017   PSA 0.9 04/15/2016     Lung cancer:  Low Dose CT Chest recommended if Age 72-80 years, 30 pack-year currently smoking OR have quit w/in 73years. Patient  is not a candidate for screening  AAA: done  ECG:  2018  Vaccines: reviewed with the patient.   Advanced Care Planning: A voluntary discussion about advance care planning including the explanation and discussion of advance directives.  Discussed health care proxy and Living will, and the patient was able to identify a health care proxy as sister .  Patient does not have a living will and power of attorney of health care   Patient Active Problem List   Diagnosis Date Noted   Morbid obesity (HCC) 11/22/2023   Primary osteoarthritis of right knee 11/22/2023   Pre-diabetes 11/22/2023   AAA (abdominal aortic aneurysm) without rupture (HCC) 12/28/2022   Senile purpura (HCC) 08/27/2020   Ectatic aorta (HCC) 08/06/2018   Atherosclerosis of aorta (HCC) 08/06/2018   Hyperlipidemia 04/22/2016   Hyperglycemia 04/15/2016   Difficulty hearing 04/13/2015   Inguinal hernia 04/13/2015   Tremor of both hands 04/13/2015    Past Surgical History:  Procedure Laterality Date   CATARACT EXTRACTION, BILATERAL     COLONOSCOPY  2015   INGUINAL HERNIA REPAIR Left 11/30/2016   Procedure: HERNIA REPAIR INGUINAL ADULT;  Surgeon: Louanne KANDICE Muse, MD;  Location: [facility]  [facility];  Service: General;  Laterality: Left;    Family History  Problem Relation Age of Onset   Dementia Mother    Cancer Father 24       Colon   Thyroid disease Sister    Mesothelioma Brother    COPD Brother     Social History   Socioeconomic History   Marital status: Single    Spouse name: Not on file   Number of children: 0   Years of education: Not on file   Highest education level: 12th grade  Occupational History   Occupation: Retired  Tobacco Use   Smoking status: Former    Current packs/day: 0.00    Average packs/day: 0.5 packs/day for 20.0 years (10.0 ttl pk-yrs)    Types: Cigarettes    Start date: 08/16/1986    Quit date: 08/16/2006    Years since quitting: 17.5   Smokeless tobacco: Never   Tobacco comments:    smoking cessation materials not required  Vaping Use   Vaping status: Never Used  Substance and Sexual Activity   Alcohol use: No    Alcohol/week: 0.0 standard drinks of alcohol   Drug use: No   Sexual activity: Not Currently    Partners: Female  Other Topics Concern   Not on file  Social History Narrative   He lives alone, brother died 03-05-2018  He was married twice but never had children   He is very close to his sister, but she lives in [place]   He sees his nephew on a regular basis.    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   Has a friend that he eats breakfast with every morning, also plays disc golf a few times a week with another friend   Social Drivers of Corporate investment banker Strain: Low Risk  (03/08/2024)   Overall Financial Resource Strain (CARDIA)    Difficulty of Paying Living Expenses: Not hard at all  Food Insecurity: No Food Insecurity (03/08/2024)   Hunger Vital Sign    Worried About Running Out of Food in the Last Year: Never true    Ran Out of Food in the Last Year: Never true  Transportation Needs: No Transportation Needs (03/08/2024)   PRAPARE - Transportation    Lack of Transportation (Medical): No    Lack  of  Transportation (Non-Medical): No  Physical Activity: Sufficiently Active (03/08/2024)   Exercise Vital Sign    Days of Exercise per Week: 4 days    Minutes of Exercise per Session: 50 min  Stress: No Stress Concern Present (03/08/2024)   [employer] of Occupational Health - Occupational Stress Questionnaire    Feeling of Stress: Not at all  Social Connections: Moderately Integrated (03/08/2024)   Social Connection and Isolation Panel    Frequency of Communication with Friends and Family: More than three times a week    Frequency of Social Gatherings with Friends and Family: More than three times a week    Attends Religious Services: 1 to 4 times per year    Active Member of [employer] or Organizations: Yes    Attends Banker Meetings: 1 to 4 times per year    Marital Status: Divorced  Intimate Partner Violence: Not At Risk (03/08/2024)   Humiliation, Afraid, Rape, and Kick questionnaire    Fear of Current or Ex-Partner: No    Emotionally Abused: No    Physically Abused: No    Sexually Abused: No     Current Outpatient Medications:    acetaminophen  (TYLENOL ) 500 MG tablet, Take 1 tablet (500 mg total) by mouth every 6 (six) hours as needed., Disp: 30 tablet, Rfl: 0   Ascorbic Acid (VITAMIN C) 1000 MG tablet, Take 1,000 mg by mouth daily., Disp: , Rfl:    aspirin  EC 81 MG tablet, Take 1 tablet (81 mg total) by mouth daily., Disp: 30 tablet, Rfl: 0   atenolol  (TENORMIN ) 25 MG tablet, Take 1 tablet (25 mg total) by mouth daily., Disp: 90 tablet, Rfl: 1   atorvastatin  (LIPITOR) 40 MG tablet, TAKE 1 TABLET(40 MG) BY MOUTH DAILY, Disp: 90 tablet, Rfl: 3   MULTIPLE VITAMIN PO, Take 1 tablet by mouth daily., Disp: , Rfl:   No Known Allergies   ROS  Constitutional: Negative for fever or weight change.  Respiratory: Negative for cough and shortness of breath.   Cardiovascular: Negative for chest pain or palpitations.  Gastrointestinal: Negative for abdominal pain, no bowel  changes.  Musculoskeletal: Negative for gait problem or joint swelling.  Skin: Negative for rash.  Neurological: Negative for dizziness or headache.  No other specific complaints in a complete review of systems (except as listed in HPI above).    Objective  Vitals:   03/08/24 0913  BP: 122/68  Pulse: 61  Resp: 16  SpO2: 95%  Weight: 229 lb (103.9 kg)  Height: 5' 10 (1.778 m)    Body mass index is 32.86 kg/m.  Physical Exam  Constitutional: Patient appears well-developed and well-nourished. No distress.  HENT: Head: Normocephalic and atraumatic. Ears: B TMs ok, no erythema or effusion; Nose: Nose normal. Mouth/Throat: Oropharynx is clear and moist. No oropharyngeal exudate.  Eyes: Conjunctivae and EOM are normal. Pupils are equal, round, and reactive to light. No scleral icterus.  Neck: Normal range of motion. Neck supple. No JVD present. No thyromegaly present.  Cardiovascular: Normal rate, regular rhythm and normal heart sounds.  No murmur heard. No BLE edema. Pulmonary/Chest: Effort normal and breath sounds normal. No respiratory distress. Abdominal: Soft. Bowel sounds are normal, no distension. There is no tenderness. no masses MALE GENITALIA: Normal descended testes bilaterally, no masses palpated, no hernias, no lesions, no discharge RECTAL: not done  Musculoskeletal: Normal range of motion, no joint effusions. No gross deformities Neurological: he is alert and oriented to person, place, and time. No  cranial nerve deficit. Coordination, balance, strength, speech and gait are normal.  Skin: Skin is warm and dry. No rash noted. No erythema.  Psychiatric: Patient has a normal mood and affect. behavior is normal. Judgment and thought content normal.   {Show previous labs (optional):23779}  Assessment & Plan   1. Well adult exam (Primary)   2. Colon cancer screening  - Cologuard   3. Need for Tdap vaccination  Get at local pharmacy    -Prostate cancer screening  and PSA options (with potential risks and benefits of testing vs not testing) were discussed along with recent recs/guidelines. -USPSTF grade A and B recommendations reviewed with patient; age-appropriate recommendations, preventive care, screening tests, etc discussed and encouraged; healthy living encouraged; see AVS for patient education given to patient -Discussed importance of 150 minutes of physical activity weekly, eat two servings of fish weekly, eat one serving of tree nuts ( cashews, pistachios, pecans, almonds.SABRA) every other day, eat 6 servings of fruit/vegetables daily and drink plenty of water and avoid sweet beverages.  -Reviewed Health Maintenance: yes

## 2024-04-26 DIAGNOSIS — Z1211 Encounter for screening for malignant neoplasm of colon: Secondary | ICD-10-CM | POA: Diagnosis not present

## 2024-05-01 LAB — COLOGUARD: COLOGUARD: NEGATIVE

## 2024-05-02 ENCOUNTER — Ambulatory Visit: Payer: Self-pay | Admitting: Family Medicine

## 2024-05-03 ENCOUNTER — Ambulatory Visit: Payer: Self-pay

## 2024-05-03 ENCOUNTER — Ambulatory Visit (INDEPENDENT_AMBULATORY_CARE_PROVIDER_SITE_OTHER)

## 2024-05-03 VITALS — Ht 70.0 in | Wt 229.0 lb

## 2024-05-03 DIAGNOSIS — Z0000 Encounter for general adult medical examination without abnormal findings: Secondary | ICD-10-CM | POA: Diagnosis not present

## 2024-05-03 DIAGNOSIS — Z23 Encounter for immunization: Secondary | ICD-10-CM

## 2024-05-03 NOTE — Patient Instructions (Signed)
 Lee Harvey,  Thank you for taking the time for your Medicare Wellness Visit. I appreciate your continued commitment to your health goals. Please review the care plan we discussed, and feel free to reach out if I can assist you further.  Medicare recommends these wellness visits once per year to help you and your care team stay ahead of potential health issues. These visits are designed to focus on prevention, allowing your provider to concentrate on managing your acute and chronic conditions during your regular appointments.  Please note that Annual Wellness Visits do not include a physical exam. Some assessments may be limited, especially if the visit was conducted virtually. If needed, we may recommend a separate in-person follow-up with your provider.  Ongoing Care Seeing your primary care provider every 3 to 6 months helps us  monitor your health and provide consistent, personalized care.   Referrals If a referral was made during today's visit and you haven't received any updates within two weeks, please contact the referred provider directly to check on the status.  Recommended Screenings:  Health Maintenance  Topic Date Due   Flu Shot  03/16/2024   DTaP/Tdap/Td vaccine (2 - Td or Tdap) 04/11/2024   COVID-19 Vaccine (4 - 2025-26 season) 04/16/2024   Medicare Annual Wellness Visit  04/27/2024   Cologuard (Stool DNA test)  04/27/2027   Pneumococcal Vaccine for age over 70  Completed   Hepatitis C Screening  Completed   Zoster (Shingles) Vaccine  Completed   HPV Vaccine  Aged Out   Meningitis B Vaccine  Aged Out       05/03/2024    8:11 AM  Advanced Directives  Does Patient Have a Medical Advance Directive? No  Would patient like information on creating a medical advance directive? No - Patient declined   Advance Care Planning is important because it: Ensures you receive medical care that aligns with your values, goals, and preferences. Provides guidance to your family and loved  ones, reducing the emotional burden of decision-making during critical moments.  Vision: Annual vision screenings are recommended for early detection of glaucoma, cataracts, and diabetic retinopathy. These exams can also reveal signs of chronic conditions such as diabetes and high blood pressure.  Dental: Annual dental screenings help detect early signs of oral cancer, gum disease, and other conditions linked to overall health, including heart disease and diabetes.  Please see the attached documents for additional preventive care recommendations.

## 2024-05-03 NOTE — Progress Notes (Signed)
 Subjective:   Lee Harvey is a 73 y.o. who presents for a Medicare Wellness preventive visit.  As a reminder, Annual Wellness Visits don't include a physical exam, and some assessments may be limited, especially if this visit is performed virtually. We may recommend an in-person follow-up visit with your provider if needed.  Visit Complete: Virtual I connected with  Lee Harvey on 05/03/24 by a audio enabled telemedicine application and verified that I am speaking with the correct person using two identifiers.  Patient Location: Home  Provider Location: Home Office  I discussed the limitations of evaluation and management by telemedicine. The patient expressed understanding and agreed to proceed.  Vital Signs: Because this visit was a virtual/telehealth visit, some criteria may be missing or patient reported. Any vitals not documented were not able to be obtained and vitals that have been documented are patient reported.  VideoDeclined- This patient declined Librarian, academic. Therefore the visit was completed with audio only.  Persons Participating in Visit: Patient.  AWV Questionnaire: Yes: Patient Medicare AWV questionnaire was completed by the patient on 05/02/24; I have confirmed that all information answered by patient is correct and no changes since this date.  Cardiac Risk Factors include: advanced age (>84men, >74 women);dyslipidemia;obesity (BMI >30kg/m2);male gender     Objective:    Today's Vitals   05/03/24 0808  Weight: 229 lb (103.9 kg)  Height: 5' 10 (1.778 m)   Body mass index is 32.86 kg/m.     05/03/2024    8:11 AM 04/28/2023    9:17 AM 04/23/2021    9:50 AM 04/22/2020    3:50 PM 01/18/2019    9:53 AM 01/13/2018    9:38 AM 05/23/2017    8:40 AM  Advanced Directives  Does Patient Have a Medical Advance Directive? No Yes No No Yes No;Yes  No   Type of Special educational needs teacher of [place];Living will   Healthcare Power  of Attorney Living will   Does patient want to make changes to medical advance directive?      Yes (MAU/Ambulatory/Procedural Areas - Information given)    Copy of Healthcare Power of Attorney in Chart?     No - copy requested     Would patient like information on creating a medical advance directive? No - Patient declined  Yes (MAU/Ambulatory/Procedural Areas - Information given) Yes (MAU/Ambulatory/Procedural Areas - Information given)        Data saved with a previous flowsheet row definition    Current Medications (verified) Outpatient Encounter Medications as of 05/03/2024  Medication Sig   acetaminophen  (TYLENOL ) 500 MG tablet Take 1 tablet (500 mg total) by mouth every 6 (six) hours as needed.   Ascorbic Acid (VITAMIN C) 1000 MG tablet Take 1,000 mg by mouth daily.   aspirin  EC 81 MG tablet Take 1 tablet (81 mg total) by mouth daily.   atenolol  (TENORMIN ) 25 MG tablet Take 1 tablet (25 mg total) by mouth daily.   atorvastatin  (LIPITOR) 40 MG tablet TAKE 1 TABLET(40 MG) BY MOUTH DAILY   MULTIPLE VITAMIN PO Take 1 tablet by mouth daily.   No facility-administered encounter medications on file as of 05/03/2024.    Allergies (verified) Patient has no known allergies.   History: Past Medical History:  Diagnosis Date   Arthritis    KNEE RIGHT   GERD (gastroesophageal reflux disease)    OCC   History of hemorrhoids    HL (hearing loss)  Hyperglycemia    Hyperlipidemia    Left inguinal hernia    Tremors of nervous system    Trigger finger    Past Surgical History:  Procedure Laterality Date   CATARACT EXTRACTION, BILATERAL     COLONOSCOPY  2015   INGUINAL HERNIA REPAIR Left 11/30/2016   Procedure: HERNIA REPAIR INGUINAL ADULT;  Surgeon: Louanne KANDICE Muse, MD;  Location: [facility] ORS;  Service: General;  Laterality: Left;   Family History  Problem Relation Age of Onset   Dementia Mother    Cancer Father 34       Colon   Thyroid disease Sister    Mesothelioma Brother     COPD Brother    Social History   Socioeconomic History   Marital status: Single    Spouse name: Not on file   Number of children: 0   Years of education: Not on file   Highest education level: 12th grade  Occupational History   Occupation: Retired  Tobacco Use   Smoking status: Former    Current packs/day: 0.00    Average packs/day: 0.5 packs/day for 20.0 years (10.0 ttl pk-yrs)    Types: Cigarettes    Start date: 08/16/1986    Quit date: 08/16/2006    Years since quitting: 17.7   Smokeless tobacco: Never   Tobacco comments:    smoking cessation materials not required  Vaping Use   Vaping status: Never Used  Substance and Sexual Activity   Alcohol use: No    Alcohol/week: 0.0 standard drinks of alcohol   Drug use: No   Sexual activity: Not Currently    Partners: Female  Other Topics Concern   Not on file  Social History Narrative   He lives alone, brother died 02/21/18  He was married twice but never had children   He is very close to his sister, but she lives in [place]   He sees his nephew on a regular basis.    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   Has a friend that he eats breakfast with every morning, also plays disc golf a few times a week with another friend   Social Drivers of Corporate investment banker Strain: Low Risk  (05/02/2024)   Overall Financial Resource Strain (CARDIA)    Difficulty of Paying Living Expenses: Not hard at all  Food Insecurity: No Food Insecurity (05/02/2024)   Hunger Vital Sign    Worried About Running Out of Food in the Last Year: Never true    Ran Out of Food in the Last Year: Never true  Transportation Needs: No Transportation Needs (05/02/2024)   PRAPARE - Administrator, Civil Service (Medical): No    Lack of Transportation (Non-Medical): No  Physical Activity: Insufficiently Active (05/02/2024)   Exercise Vital Sign    Days of Exercise per Week: 2 days    Minutes of Exercise per Session: 40 min  Stress: No  Stress Concern Present (05/02/2024)   [employer] of Occupational Health - Occupational Stress Questionnaire    Feeling of Stress: Not at all  Social Connections: Moderately Integrated (05/02/2024)   Social Connection and Isolation Panel    Frequency of Communication with Friends and Family: Three times a week    Frequency of Social Gatherings with Friends and Family: Three times a week    Attends Religious Services: 1 to 4 times per year    Active Member of Clubs or Organizations: Yes    Attends  Club or Organization Meetings: Never    Marital Status: Divorced    Tobacco Counseling Counseling given: Not Answered Tobacco comments: smoking cessation materials not required    Clinical Intake:  Pre-visit preparation completed: Yes  Pain : No/denies pain     BMI - recorded: 32.86 Nutritional Status: BMI > 30  Obese Nutritional Risks: None Diabetes: No  Lab Results  Component Value Date   HGBA1C 6.2 (H) 05/24/2023   HGBA1C 5.8 (H) 04/15/2021   HGBA1C 5.9 (H) 04/09/2020     How often do you need to have someone help you when you read instructions, pamphlets, or other written materials from your doctor or pharmacy?: 1 - Never  Interpreter Needed?: No  Information entered by :: Ellouise Haws, LPN   Activities of Daily Living     05/02/2024    4:06 PM 03/08/2024    9:00 AM  In your present state of health, do you have any difficulty performing the following activities:  Hearing? 1 1  Comment HOH   Vision? 0 0  Difficulty concentrating or making decisions? 0 0  Walking or climbing stairs? 0 0  Dressing or bathing? 0 0  Doing errands, shopping? 0 0  Preparing Food and eating ? N   Using the Toilet? N   In the past six months, have you accidently leaked urine? N   Do you have problems with loss of bowel control? N   Managing your Medications? N   Managing your Finances? N   Housekeeping or managing your Housekeeping? N     Patient Care Team: Sowles, Krichna,  MD as PCP - General (Family Medicine) Carolee Manus DASEN., MD as Consulting Physician (Ophthalmology) Marea Selinda RAMAN, MD as Referring Physician (Vascular Surgery)  I have updated your Care Teams any recent Medical Services you may have received from other providers in the past year.     Assessment:   This is a routine wellness examination for Lee Harvey.  Hearing/Vision screen Hearing Screening - Comments:: Pt stated HOH no hearing aids  Vision Screening - Comments:: Wears rx glasses - pt encouraged to follow up with  Dr Carolee    Goals Addressed             This Visit's Progress    Patient Stated       Weight loss        Depression Screen     05/03/2024    8:11 AM 03/08/2024    9:00 AM 11/22/2023    8:16 AM 05/24/2023    8:09 AM 04/28/2023    9:14 AM 11/16/2022    9:53 AM 09/14/2022    7:41 AM  PHQ 2/9 Scores  PHQ - 2 Score 0 0 0 0 0 0 0  PHQ- 9 Score   0 0  0 0    Fall Risk     05/02/2024    4:06 PM 03/08/2024    9:00 AM 11/22/2023    8:16 AM 05/24/2023    8:09 AM 04/28/2023    9:07 AM  Fall Risk   Falls in the past year? 0 0 0 0 0  Number falls in past yr: 0 0 0 0 0  Injury with Fall? 0 0 0 0 0  Risk for fall due to : No Fall Risks No Fall Risks No Fall Risks No Fall Risks No Fall Risks  Follow up Falls prevention discussed Falls prevention discussed Falls prevention discussed;Education provided;Falls evaluation completed Falls prevention discussed Education provided;Falls prevention discussed  MEDICARE RISK AT HOME:  Medicare Risk at Home Any stairs in or around the home?: (Patient-Rptd) No If so, are there any without handrails?: No Home free of loose throw rugs in walkways, pet beds, electrical cords, etc?: (Patient-Rptd) Yes Adequate lighting in your home to reduce risk of falls?: (Patient-Rptd) Yes Life alert?: (Patient-Rptd) No Use of a cane, walker or w/c?: (Patient-Rptd) No Grab bars in the bathroom?: (Patient-Rptd) No Shower chair or bench in shower?:  (Patient-Rptd) No Elevated toilet seat or a handicapped toilet?: (Patient-Rptd) No  TIMED UP AND GO:  Was the test performed?  No  Cognitive Function: 6CIT completed        05/03/2024    8:13 AM 04/28/2023    9:19 AM 04/27/2022    9:45 AM 01/18/2019    9:58 AM 01/13/2018    9:39 AM  6CIT Screen  What Year? 0 points 0 points 0 points 0 points 0 points  What month? 0 points 0 points 0 points 0 points 0 points  What time? 0 points 0 points 0 points 0 points 0 points  Count back from 20 0 points 0 points 0 points 0 points 0 points  Months in reverse 0 points 0 points 0 points 0 points 0 points  Repeat phrase 0 points 0 points 0 points 0 points 6 points  Total Score 0 points 0 points 0 points 0 points 6 points    Immunizations Immunization History  Administered Date(s) Administered   Fluad Quad(high Dose 65+) 05/21/2019, 04/22/2020, 04/15/2021   Fluad Trivalent(High Dose 65+) 04/28/2023   INFLUENZA, HIGH DOSE SEASONAL PF 08/01/2018, 04/27/2022   Influenza, Seasonal, Injecte, Preservative Fre 08/18/2011   Influenza,inj,Quad PF,6+ Mos 04/11/2014, 04/14/2015, 04/15/2016, 05/23/2017   Influenza-Unspecified 04/11/2014   PFIZER(Purple Top)SARS-COV-2 Vaccination 09/26/2019, 10/17/2019, 04/29/2021   Pneumococcal Conjugate-13 01/13/2018   Pneumococcal Polysaccharide-23 01/18/2019   RSV,unspecified 09/14/2022   Respiratory Syncytial Virus Vaccine,Recomb Aduvanted(Arexvy) 09/14/2022   Tdap 04/11/2014   Zoster Recombinant(Shingrix ) 04/13/2022, 09/14/2022   Zoster, Live 04/15/2016    Screening Tests Health Maintenance  Topic Date Due   Influenza Vaccine  03/16/2024   DTaP/Tdap/Td (2 - Td or Tdap) 04/11/2024   COVID-19 Vaccine (4 - 2025-26 season) 04/16/2024   Medicare Annual Wellness (AWV)  05/03/2025   Fecal DNA (Cologuard)  04/27/2027   Pneumococcal Vaccine: 50+ Years  Completed   Hepatitis C Screening  Completed   Zoster Vaccines- Shingrix   Completed   HPV VACCINES  Aged Out    Meningococcal B Vaccine  Aged Out    Health Maintenance Items Addressed: See Nurse Notes at the end of this note  Additional Screening:  Vision Screening: Recommended annual ophthalmology exams for early detection of glaucoma and other disorders of the eye. Is the patient up to date with their annual eye exam?  No  Who is the provider or what is the name of the office in which the patient attends annual eye exams? Dr Carolee  Dental Screening: Recommended annual dental exams for proper oral hygiene  Community Resource Referral / Chronic Care Management: CRR required this visit?  No   CCM required this visit?  No   Plan:    I have personally reviewed and noted the following in the patient's chart:   Medical and social history Use of alcohol, tobacco or illicit drugs  Current medications and supplements including opioid prescriptions. Patient is not currently taking opioid prescriptions. Functional ability and status Nutritional status Physical activity Advanced directives List of other physicians Hospitalizations, surgeries, and  ER visits in previous 12 months Vitals Screenings to include cognitive, depression, and falls Referrals and appointments  In addition, I have reviewed and discussed with patient certain preventive protocols, quality metrics, and best practice recommendations. A written personalized care plan for preventive services as well as general preventive health recommendations were provided to patient.   Ellouise VEAR Haws, LPN   0/[id number]   After Visit Summary: (MyChart) Due to this being a telephonic visit, the after visit summary with patients personalized plan was offered to patient via MyChart   Notes: Nothing significant to report at this time.

## 2024-05-23 ENCOUNTER — Encounter: Payer: Self-pay | Admitting: Family Medicine

## 2024-05-23 ENCOUNTER — Ambulatory Visit: Admitting: Family Medicine

## 2024-05-23 VITALS — BP 130/72 | HR 55 | Temp 97.7°F | Resp 18 | Ht 70.0 in | Wt 234.7 lb

## 2024-05-23 DIAGNOSIS — Z23 Encounter for immunization: Secondary | ICD-10-CM | POA: Diagnosis not present

## 2024-05-23 DIAGNOSIS — I700 Atherosclerosis of aorta: Secondary | ICD-10-CM | POA: Diagnosis not present

## 2024-05-23 DIAGNOSIS — R251 Tremor, unspecified: Secondary | ICD-10-CM | POA: Diagnosis not present

## 2024-05-23 DIAGNOSIS — E7800 Pure hypercholesterolemia, unspecified: Secondary | ICD-10-CM

## 2024-05-23 DIAGNOSIS — R7303 Prediabetes: Secondary | ICD-10-CM | POA: Diagnosis not present

## 2024-05-23 DIAGNOSIS — Z79899 Other long term (current) drug therapy: Secondary | ICD-10-CM

## 2024-05-23 DIAGNOSIS — I7143 Infrarenal abdominal aortic aneurysm, without rupture: Secondary | ICD-10-CM | POA: Diagnosis not present

## 2024-05-23 DIAGNOSIS — M1711 Unilateral primary osteoarthritis, right knee: Secondary | ICD-10-CM

## 2024-05-23 MED ORDER — ATENOLOL 25 MG PO TABS: 25.0000 mg | ORAL_TABLET | Freq: Every day | ORAL | 1 refills | Status: AC

## 2024-05-23 NOTE — Progress Notes (Signed)
 Name: Lee Harvey   MRN: [id number]    DOB: [birth year]   Date:05/23/2024       Progress Note  Subjective  Chief Complaint  Chief Complaint  Patient presents with   Medical Management of Chronic Issues   Hyperlipidemia   Hypertension   Discussed the use of AI scribe software for clinical note transcription with the patient, who gave verbal consent to proceed.  History of Present Illness Lee Harvey is a 73 year old male who presents for a six-month follow-up visit.  He feels better than during his last wellness visit and has no concerns from that visit. He recently had a virtual wellness visit where he received a flu shot.  He has a history of sinus bradycardia, with a heart rate of 53 on today's EKG. He takes atenolol , which may contribute to his slower heart rate.  He has an infra-renal abnormal aortic aneurysm, last measured at 2.9 cm, slightly smaller than previous measurements. He is on atorvastatin  40 mg.  He has been working on weight loss and has successfully lost 10 pounds in the last six months, reducing his weight from 244 lbs to 234 lbs. He reports cutting down on sweet beverages and increasing physical activity, such as walking at the mall two to three times a week.  He has prediabetes, with a previous A1c of 6.2. He has been reducing carbohydrates and sweet beverages.  He experiences arthritis in his right knee, which occasionally feels unstable when walking, but he has not fallen or lost balance due to it. He uses Tylenol  as needed for pain. No muscle aches, neck pain, headaches, or swelling.  He has a family history of tremors, with reports from cousins about familial tremors on his father's side. He experiences tremors, particularly in his left hand, but they do not affect his daily activities. He takes atenolol  for tremors.  He takes aspirin  with his cholesterol medication and continues to take vitamin C.    Patient Active Problem List   Diagnosis Date Noted    Morbid obesity (HCC) 11/22/2023   Primary osteoarthritis of right knee 11/22/2023   Pre-diabetes 11/22/2023   AAA (abdominal aortic aneurysm) without rupture 12/28/2022   Senile purpura 08/27/2020   Ectatic aorta 08/06/2018   Atherosclerosis of aorta 08/06/2018   Hyperlipidemia 04/22/2016   Hyperglycemia 04/15/2016   Difficulty hearing 04/13/2015   Inguinal hernia 04/13/2015   Tremor of both hands 04/13/2015    Past Surgical History:  Procedure Laterality Date   CATARACT EXTRACTION, BILATERAL     COLONOSCOPY  2015   INGUINAL HERNIA REPAIR Left 11/30/2016   Procedure: HERNIA REPAIR INGUINAL ADULT;  Surgeon: Louanne KANDICE Muse, MD;  Location: [facility] ORS;  Service: General;  Laterality: Left;    Family History  Problem Relation Age of Onset   Dementia Mother    Cancer Father 57       Colon   Thyroid disease Sister    Mesothelioma Brother    COPD Brother     Social History   Tobacco Use   Smoking status: Former    Current packs/day: 0.00    Average packs/day: 0.5 packs/day for 20.0 years (10.0 ttl pk-yrs)    Types: Cigarettes    Start date: 08/16/1986    Quit date: 08/16/2006    Years since quitting: 17.7   Smokeless tobacco: Never   Tobacco comments:    smoking cessation materials not required  Substance Use Topics   Alcohol use: No  Alcohol/week: 0.0 standard drinks of alcohol     Current Outpatient Medications:    acetaminophen  (TYLENOL ) 500 MG tablet, Take 1 tablet (500 mg total) by mouth every 6 (six) hours as needed., Disp: 30 tablet, Rfl: 0   Ascorbic Acid (VITAMIN C) 1000 MG tablet, Take 1,000 mg by mouth daily., Disp: , Rfl:    aspirin  EC 81 MG tablet, Take 1 tablet (81 mg total) by mouth daily., Disp: 30 tablet, Rfl: 0   atenolol  (TENORMIN ) 25 MG tablet, Take 1 tablet (25 mg total) by mouth daily., Disp: 90 tablet, Rfl: 1   atorvastatin  (LIPITOR) 40 MG tablet, TAKE 1 TABLET(40 MG) BY MOUTH DAILY, Disp: 90 tablet, Rfl: 3   MULTIPLE VITAMIN PO, Take 1 tablet  by mouth daily., Disp: , Rfl:   No Known Allergies  I personally reviewed active problem list, medication list, allergies with the patient/caregiver today.   ROS  Ten systems reviewed and is negative except as mentioned in HPI    Objective Physical Exam  CONSTITUTIONAL: Patient appears well-developed and well-nourished. No distress. HEENT: Head atraumatic, normocephalic, neck supple. CARDIOVASCULAR: Normal rate, regular rhythm and normal heart sounds. No murmur heard. No edema in extremities. PULMONARY: Effort normal and breath sounds normal. Lungs clear to auscultation bilaterally. No respiratory distress. MUSCULOSKELETAL: Normal gait. Without gross motor or sensory deficit. NEURO: hand tremors present PSYCHIATRIC: Patient has a normal mood and affect. Behavior is normal. Judgment and thought content normal.  Vitals:   05/23/24 0804  BP: 130/72  Pulse: (!) 55  Resp: 18  Temp: 97.7 F (36.5 C)  SpO2: 98%  Weight: 234 lb 11.2 oz (106.5 kg)  Height: 5' 10 (1.778 m)    Body mass index is 33.68 kg/m.  Recent Results (from the past 2160 hours)  Cologuard     Status: None   Collection Time: 04/26/24  2:05 PM  Result Value Ref Range   COLOGUARD Negative Negative    Comment: The Cologuard (TM) test was performed on this specimen.  NEGATIVE TEST RESULT. A negative Cologuard result indicates a low likelihood that a colorectal cancer (CRC) or advanced adenoma (adenomatous polyps with more advanced pre-malignant features) is present. The chance that a person with a negative Cologuard test has a colorectal cancer is less than 1 in 1500 (negative predictive value >99.9%) or has an advanced adenoma is less than 5.3% (negative predictive value 94.7%). These data are based on a prospective cross-sectional study of 10,000 individuals at average risk for colorectal cancer who were screened with both Cologuard and colonoscopy. (Imperiale T. et al, N Engl J Med 2014;370(14):1286-1297) The  normal value (reference range) for this assay is negative.  COLOGUARD RE-SCREENING RECOMMENDATION: Periodic colorectal cancer screening is an important part of preventive healthcare for asymptomatic individuals at average risk for colorectal cancer. Following a negative Cologuard  result, the American Cancer Society and U.S. Multi-Society Task Force screening guidelines recommend a Cologuard re-screening interval of 3 years.  References: American Cancer Society Guideline for Colorectal Cancer Screening: https://www.cancer.org/cancer/colon-rectal-cancer/detection-diagnosis-staging/acs-recommendations.html.; Rex DK, Boland CR, Dominitz JK, Colorectal Cancer Screening: Recommendations for Physicians and Patients from the U.S. Multi-Society Task Force on Colorectal Cancer Screening , Am J Gastroenterology 2017; 112:1016-1030.  TEST DESCRIPTION: Composite algorithmic analysis of stool DNA-biomarkers with hemoglobin immunoassay.   Quantitative values of individual biomarkers are not reportable and are not associated with individual biomarker result reference ranges. Cologuard is intended for colorectal cancer screening of adults of either sex, 45 years or older, who are at average-risk for  colorectal cancer (CRC). Cologuard has been approved for use by the U.S.  FDA. The performance of Cologuard was established in a cross sectional study of average-risk adults aged 45-84. Cologuard performance in patients ages 14 to 37 years was estimated by sub-group analysis of near-age groups. Colonoscopies performed for a positive result may find as the most clinically significant lesion: colorectal cancer [4.0%], advanced adenoma (including sessile serrated polyps greater than or equal to 1cm diameter) [20%] or non- advanced adenoma [31%]; or no colorectal neoplasia [45%]. These estimates are derived from a prospective cross-sectional screening study of 10,000 individuals at average risk for colorectal cancer who were screened  with both Cologuard and colonoscopy. (Imperiale T. et al, LOISE [place] J Med 2014;370(14):1286-1297.) Cologuard may produce a false negative or false positive result (no colorectal cancer or precancerous polyp present at colonoscopy follow up). A negative Cologuard test result does not guarantee the absence of CRC or advanced adenoma  (pre-cancer). The current Cologuard screening interval is every 3 years. Science writer and [place] Therapist, music). Cologuard performance data in a 10,000 patient pivotal study using colonoscopy as the reference method can be accessed at the following location: www.exactlabs.com/results. Additional description of the Cologuard test process, warnings and precautions can be found at www.cologuard.com.       PHQ2/9:    05/03/2024    8:11 AM 03/08/2024    9:00 AM 11/22/2023    8:16 AM 05/24/2023    8:09 AM 04/28/2023    9:14 AM  Depression screen PHQ 2/9  Decreased Interest 0 0 0 0 0  Down, Depressed, Hopeless 0 0 0 0 0  PHQ - 2 Score 0 0 0 0 0  Altered sleeping   0 0   Tired, decreased energy   0 0   Change in appetite   0 0   Feeling bad or failure about yourself    0 0   Trouble concentrating   0 0   Moving slowly or fidgety/restless   0 0   Suicidal thoughts   0 0   PHQ-9 Score   0 0   Difficult doing work/chores   Not difficult at all      phq 9 is negative  Fall Risk:    05/02/2024    4:06 PM 03/08/2024    9:00 AM 11/22/2023    8:16 AM 05/24/2023    8:09 AM 04/28/2023    9:07 AM  Fall Risk   Falls in the past year? 0 0 0 0 0  Number falls in past yr: 0 0 0 0 0  Injury with Fall? 0 0 0 0 0  Risk for fall due to : No Fall Risks No Fall Risks No Fall Risks No Fall Risks No Fall Risks  Follow up Falls prevention discussed Falls prevention discussed Falls prevention discussed;Education provided;Falls evaluation completed Falls prevention discussed Education provided;Falls prevention discussed      Assessment & Plan Infra-renal Abdominal  aortic aneurysm without rupture Abdominal aortic aneurysm 2.9 cm, stable, no rupture. - Continue annual follow-up with vascular specialist. - Continue atorvastatin  for cholesterol management.  Pure hypercholesterolemia/Atherosclerosis  Cholesterol managed with atorvastatin  40 mg due to aneurysm risk. - Check lipid panel. - Continue atorvastatin  40 mg daily.  Prediabetes A1c previously 5.8-6.2, lifestyle modifications in place. - Check A1c and fasting glucose levels. - Continue lifestyle modifications.  Obesity BMI improved, 10-pound weight loss achieved. - Continue current diet and exercise regimen.  Right knee osteoarthritis Intermittent knee instability, no  falls. - Use Tylenol  as needed for pain.  Essential tremor, left hand worse Familial tremor, left hand more affected, no daily impact. - Continue atenolol . - Consider neurologist referral if symptoms worsen.  General Health Maintenance Routine vaccinations and screenings discussed, tetanus due. - Administer Tdap vaccine at pharmacy. - Schedule next follow-up in six months.

## 2024-05-24 ENCOUNTER — Ambulatory Visit: Payer: Self-pay | Admitting: Family Medicine

## 2024-05-24 LAB — CBC WITH DIFFERENTIAL/PLATELET
Absolute Lymphocytes: 2280 {cells}/uL (ref 850–3900)
Absolute Monocytes: 638 {cells}/uL (ref 200–950)
Basophils Absolute: 91 {cells}/uL (ref 0–200)
Basophils Relative: 1.2 %
Eosinophils Absolute: 410 {cells}/uL (ref 15–500)
Eosinophils Relative: 5.4 %
HCT: 45.6 % (ref 38.5–50.0)
Hemoglobin: 15.1 g/dL (ref 13.2–17.1)
MCH: 31.5 pg (ref 27.0–33.0)
MCHC: 33.1 g/dL (ref 32.0–36.0)
MCV: 95.2 fL (ref 80.0–100.0)
MPV: 10.7 fL (ref 7.5–12.5)
Monocytes Relative: 8.4 %
Neutro Abs: 4180 {cells}/uL (ref 1500–7800)
Neutrophils Relative %: 55 %
Platelets: 189 Thousand/uL (ref 140–400)
RBC: 4.79 Million/uL (ref 4.20–5.80)
RDW: 12.5 % (ref 11.0–15.0)
Total Lymphocyte: 30 %
WBC: 7.6 Thousand/uL (ref 3.8–10.8)

## 2024-05-24 LAB — COMPREHENSIVE METABOLIC PANEL WITH GFR
AG Ratio: 2 (calc) (ref 1.0–2.5)
ALT: 22 U/L (ref 9–46)
AST: 19 U/L (ref 10–35)
Albumin: 4.5 g/dL (ref 3.6–5.1)
Alkaline phosphatase (APISO): 118 U/L (ref 35–144)
BUN: 12 mg/dL (ref 7–25)
CO2: 30 mmol/L (ref 20–32)
Calcium: 9.5 mg/dL (ref 8.6–10.3)
Chloride: 104 mmol/L (ref 98–110)
Creat: 1.21 mg/dL (ref 0.70–1.28)
Globulin: 2.3 g/dL (ref 1.9–3.7)
Glucose, Bld: 106 mg/dL — ABNORMAL HIGH (ref 65–99)
Potassium: 5.3 mmol/L (ref 3.5–5.3)
Sodium: 141 mmol/L (ref 135–146)
Total Bilirubin: 0.7 mg/dL (ref 0.2–1.2)
Total Protein: 6.8 g/dL (ref 6.1–8.1)
eGFR: 64 mL/min/1.73m2 (ref >=60)

## 2024-05-24 LAB — HEMOGLOBIN A1C
Hgb A1c MFr Bld: 6.2 % — ABNORMAL HIGH (ref <5.7)
Mean Plasma Glucose: 131 mg/dL
eAG (mmol/L): 7.3 mmol/L

## 2024-05-24 LAB — LIPID PANEL
Cholesterol: 124 mg/dL (ref <200)
HDL: 53 mg/dL (ref >=40)
LDL Cholesterol (Calc): 56 mg/dL
Non-HDL Cholesterol (Calc): 71 mg/dL (ref <130)
Total CHOL/HDL Ratio: 2.3 (calc) (ref <5.0)
Triglycerides: 71 mg/dL (ref <150)

## 2024-08-24 ENCOUNTER — Other Ambulatory Visit: Payer: Self-pay | Admitting: Family Medicine

## 2024-08-24 DIAGNOSIS — I700 Atherosclerosis of aorta: Secondary | ICD-10-CM

## 2024-08-24 DIAGNOSIS — E7800 Pure hypercholesterolemia, unspecified: Secondary | ICD-10-CM

## 2024-08-24 NOTE — Telephone Encounter (Signed)
 Requested Prescriptions  Pending Prescriptions Disp Refills   atorvastatin  (LIPITOR) 40 MG tablet [Pharmacy Med Name: ATORVASTATIN  40MG  TABLETS] 90 tablet 3    Sig: TAKE 1 TABLET(40 MG) BY MOUTH DAILY     Cardiovascular:  Antilipid - Statins Failed - 08/24/2024  3:46 PM      Failed - Lipid Panel in normal range within the last 12 months    Cholesterol, Total  Date Value Ref Range Status  04/14/2015 148 100 - 199 mg/dL Final   Cholesterol  Date Value Ref Range Status  05/23/2024 124 <200 mg/dL Final   LDL Cholesterol (Calc)  Date Value Ref Range Status  05/23/2024 56 mg/dL (calc) Final    Comment:    Reference range: <100 . Desirable range <100 mg/dL for primary prevention;   <70 mg/dL for patients with CHD or diabetic patients  with > or = 2 CHD risk factors. SABRA LDL-C is now calculated using the Martin-Hopkins  calculation, which is a validated novel method providing  better accuracy than the Friedewald equation in the  estimation of LDL-C.  Gladis APPLETHWAITE et al. SANDREA. 7986;689(80): 2061-2068  (http://education.[facility].com/faq/FAQ164)    HDL  Date Value Ref Range Status  05/23/2024 53 > OR = 40 mg/dL Final  91/70/7983 57 >60 mg/dL Final    Comment:    According to ATP-III Guidelines, HDL-C >59 mg/dL is considered a negative risk factor for CHD.    Triglycerides  Date Value Ref Range Status  05/23/2024 71 <150 mg/dL Final         Passed - Patient is not pregnant      Passed - Valid encounter within last 12 months    Recent Outpatient Visits           3 months ago Atherosclerosis of aorta   [facility] Glenard Mire, MD   5 months ago Well adult exam   [facility] Glenard Mire, MD   9 months ago Atherosclerosis of aorta   [facility] Glenard Mire, MD       Future Appointments             In 7 months Glenard, Krichna, MD [facility],  [place]

## 2024-11-21 ENCOUNTER — Ambulatory Visit: Admitting: Family Medicine

## 2025-02-01 ENCOUNTER — Other Ambulatory Visit (INDEPENDENT_AMBULATORY_CARE_PROVIDER_SITE_OTHER)

## 2025-02-01 ENCOUNTER — Ambulatory Visit (INDEPENDENT_AMBULATORY_CARE_PROVIDER_SITE_OTHER): Admitting: Vascular Surgery

## 2025-03-27 ENCOUNTER — Encounter: Admitting: Family Medicine
